# Patient Record
Sex: Female | Born: 1980
Health system: Southern US, Community
[De-identification: ages and names within clinical notes are randomized; demographics above are authoritative.]

## PROBLEM LIST (undated history)

## (undated) DIAGNOSIS — K219 Gastro-esophageal reflux disease without esophagitis: Secondary | ICD-10-CM

## (undated) DIAGNOSIS — F3112 Bipolar disorder, current episode manic without psychotic features, moderate: Secondary | ICD-10-CM

## (undated) DIAGNOSIS — F988 Other specified behavioral and emotional disorders with onset usually occurring in childhood and adolescence: Secondary | ICD-10-CM

## (undated) HISTORY — PX: CHOLECYSTECTOMY: SHX55

## (undated) HISTORY — PX: APPENDECTOMY: SHX54

## (undated) HISTORY — PX: TUMOR REMOVAL: SHX12

---

## 2013-03-10 DIAGNOSIS — J309 Allergic rhinitis, unspecified: Secondary | ICD-10-CM | POA: Insufficient documentation

## 2013-03-10 DIAGNOSIS — Z720 Tobacco use: Secondary | ICD-10-CM | POA: Insufficient documentation

## 2013-03-10 DIAGNOSIS — F319 Bipolar disorder, unspecified: Secondary | ICD-10-CM | POA: Insufficient documentation

## 2013-03-10 DIAGNOSIS — G43909 Migraine, unspecified, not intractable, without status migrainosus: Secondary | ICD-10-CM | POA: Insufficient documentation

## 2013-03-10 DIAGNOSIS — E669 Obesity, unspecified: Secondary | ICD-10-CM | POA: Insufficient documentation

## 2013-07-21 DIAGNOSIS — G5601 Carpal tunnel syndrome, right upper limb: Secondary | ICD-10-CM | POA: Insufficient documentation

## 2013-09-09 DIAGNOSIS — M940 Chondrocostal junction syndrome [Tietze]: Secondary | ICD-10-CM | POA: Insufficient documentation

## 2013-09-11 DIAGNOSIS — R0789 Other chest pain: Secondary | ICD-10-CM | POA: Insufficient documentation

## 2013-09-20 DIAGNOSIS — M9908 Segmental and somatic dysfunction of rib cage: Secondary | ICD-10-CM | POA: Insufficient documentation

## 2014-03-10 DIAGNOSIS — R5383 Other fatigue: Secondary | ICD-10-CM | POA: Insufficient documentation

## 2014-12-28 DIAGNOSIS — K529 Noninfective gastroenteritis and colitis, unspecified: Secondary | ICD-10-CM | POA: Insufficient documentation

## 2014-12-28 DIAGNOSIS — R109 Unspecified abdominal pain: Secondary | ICD-10-CM | POA: Insufficient documentation

## 2015-03-19 ENCOUNTER — Other Ambulatory Visit: Payer: Self-pay

## 2015-03-19 ENCOUNTER — Ambulatory Visit: Payer: BLUE CROSS/BLUE SHIELD

## 2015-03-19 ENCOUNTER — Encounter: Payer: Self-pay | Admitting: Emergency Medicine

## 2015-03-19 ENCOUNTER — Ambulatory Visit
Admission: EM | Admit: 2015-03-19 | Discharge: 2015-03-19 | Disposition: A | Discharge status: Home / Self Care | Payer: BLUE CROSS/BLUE SHIELD | Attending: Family Medicine | Admitting: Family Medicine

## 2015-03-19 DIAGNOSIS — R091 Pleurisy: Secondary | ICD-10-CM | POA: Diagnosis present

## 2015-03-19 DIAGNOSIS — R1032 Left lower quadrant pain: Secondary | ICD-10-CM

## 2015-03-19 DIAGNOSIS — M940 Chondrocostal junction syndrome [Tietze]: Secondary | ICD-10-CM | POA: Diagnosis not present

## 2015-03-19 DIAGNOSIS — R0789 Other chest pain: Secondary | ICD-10-CM | POA: Diagnosis not present

## 2015-03-19 DIAGNOSIS — R319 Hematuria, unspecified: Secondary | ICD-10-CM | POA: Diagnosis not present

## 2015-03-19 DIAGNOSIS — F319 Bipolar disorder, unspecified: Secondary | ICD-10-CM | POA: Diagnosis not present

## 2015-03-19 DIAGNOSIS — F17200 Nicotine dependence, unspecified, uncomplicated: Secondary | ICD-10-CM | POA: Diagnosis not present

## 2015-03-19 DIAGNOSIS — R079 Chest pain, unspecified: Secondary | ICD-10-CM | POA: Diagnosis not present

## 2015-03-19 HISTORY — DX: Other specified behavioral and emotional disorders with onset usually occurring in childhood and adolescence: F98.8

## 2015-03-19 HISTORY — DX: Bipolar disorder, current episode manic without psychotic features, moderate: F31.12

## 2015-03-19 LAB — LIPASE, BLOOD: Lipase: 25 U/L (ref 11–51)

## 2015-03-19 LAB — CBC WITH DIFFERENTIAL/PLATELET
BASOS ABS: 0.1 10*3/uL (ref 0–0.1)
Basophils Relative: 1 %
Eosinophils Absolute: 0.1 10*3/uL (ref 0–0.7)
Eosinophils Relative: 2 %
HEMATOCRIT: 42.5 % (ref 35.0–47.0)
Hemoglobin: 14.4 g/dL (ref 12.0–16.0)
LYMPHS PCT: 43 %
Lymphs Abs: 3.8 10*3/uL — ABNORMAL HIGH (ref 1.0–3.6)
MCH: 29.4 pg (ref 26.0–34.0)
MCHC: 33.8 g/dL (ref 32.0–36.0)
MCV: 86.9 fL (ref 80.0–100.0)
Monocytes Absolute: 0.5 10*3/uL (ref 0.2–0.9)
Monocytes Relative: 6 %
NEUTROS ABS: 4.4 10*3/uL (ref 1.4–6.5)
NEUTROS PCT: 50 %
Platelets: 293 10*3/uL (ref 150–440)
RBC: 4.89 MIL/uL (ref 3.80–5.20)
RDW: 13.4 % (ref 11.5–14.5)
WBC: 8.9 10*3/uL (ref 3.6–11.0)

## 2015-03-19 LAB — COMPREHENSIVE METABOLIC PANEL
ALT: 36 U/L (ref 14–54)
ANION GAP: 11 (ref 5–15)
AST: 30 U/L (ref 15–41)
Albumin: 4.6 g/dL (ref 3.5–5.0)
Alkaline Phosphatase: 184 U/L — ABNORMAL HIGH (ref 38–126)
BILIRUBIN TOTAL: 0.6 mg/dL (ref 0.3–1.2)
BUN: 11 mg/dL (ref 6–20)
CHLORIDE: 98 mmol/L — AB (ref 101–111)
CO2: 26 mmol/L (ref 22–32)
Calcium: 9.7 mg/dL (ref 8.9–10.3)
Creatinine, Ser: 0.79 mg/dL (ref 0.44–1.00)
GFR calc Af Amer: >60 mL/min (ref >60)
GLUCOSE: 91 mg/dL (ref 65–99)
POTASSIUM: 3.8 mmol/L (ref 3.5–5.1)
Sodium: 135 mmol/L (ref 135–145)
TOTAL PROTEIN: 8.2 g/dL — AB (ref 6.5–8.1)

## 2015-03-19 LAB — PREGNANCY, URINE: Preg Test, Ur: NEGATIVE

## 2015-03-19 LAB — URINALYSIS COMPLETE WITH MICROSCOPIC (ARMC ONLY)
BILIRUBIN URINE: NEGATIVE
GLUCOSE, UA: NEGATIVE mg/dL
KETONES UR: NEGATIVE mg/dL
LEUKOCYTES UA: NEGATIVE
Nitrite: NEGATIVE
Protein, ur: NEGATIVE mg/dL
Specific Gravity, Urine: 1.015 (ref 1.005–1.030)
WBC, UA: NONE SEEN WBC/hpf (ref <3)
pH: 5.5 (ref 5.0–8.0)

## 2015-03-19 LAB — TROPONIN I

## 2015-03-19 LAB — CK: CK TOTAL: 121 U/L (ref 38–234)

## 2015-03-19 LAB — AMYLASE: AMYLASE: 57 U/L (ref 28–100)

## 2015-03-19 LAB — FIBRIN DERIVATIVES D-DIMER (ARMC ONLY): FIBRIN DERIVATIVES D-DIMER (ARMC): 460.28 (ref 0–499)

## 2015-03-19 LAB — CKMB (ARMC ONLY): CK, MB: 1.5 ng/mL (ref 0.5–5.0)

## 2015-03-19 MED ORDER — KETOROLAC TROMETHAMINE 60 MG/2ML IM SOLN: 60.0000 mg | Freq: Once | INTRAMUSCULAR | Status: DC

## 2015-03-19 MED ORDER — MELOXICAM 15 MG PO TABS: 15.0000 mg | ORAL_TABLET | Freq: Every day | ORAL | Status: DC

## 2015-03-19 NOTE — Discharge Instructions (Signed)
Abdominal Pain, Adult Many things can cause belly (abdominal) pain. Most times, the belly pain is not dangerous. Many cases of belly pain can be watched and treated at home. HOME CARE   Do not take medicines that help you go poop (laxatives) unless told to by your doctor.  Only take medicine as told by your doctor.  Eat or drink as told by your doctor. Your doctor will tell you if you should be on a special diet. GET HELP IF:  You do not know what is causing your belly pain.  You have belly pain while you are sick to your stomach (nauseous) or have runny poop (diarrhea).  You have pain while you pee or poop.  Your belly pain wakes you up at night.  You have belly pain that gets worse or better when you eat.  You have belly pain that gets worse when you eat fatty foods.  You have a fever. GET HELP RIGHT AWAY IF:   The pain does not go away within 2 hours.  You keep throwing up (vomiting).  The pain changes and is only in the right or left part of the belly.  You have bloody or tarry looking poop. MAKE SURE YOU:   Understand these instructions.  Will watch your condition.  Will get help right away if you are not doing well or get worse.   This information is not intended to replace advice given to you by your health care provider. Make sure you discuss any questions you have with your health care provider.   Document Released: 10/22/2007 Document Revised: 05/26/2014 Document Reviewed: 01/12/2013 Elsevier Interactive Patient Education 2016 Else[employer]  Chest Wall Pain Chest wall pain is pain in or around the bones and muscles of your chest. Sometimes, an injury causes this pain. Sometimes, the cause may not be known. This pain may take several weeks or longer to get better. HOME CARE Pay attention to any changes in your symptoms. Take these actions to help with your pain:  Rest as told by your doctor.  Avoid activities that cause pain. Try not to use your chest,  belly (abdominal), or side muscles to lift heavy things.  If directed, apply ice to the painful area:  Put ice in a plastic bag.  Place a towel between your skin and the bag.  Leave the ice on for 20 minutes, 2-3 times per day.  Take over-the-counter and prescription medicines only as told by your doctor.  Do not use tobacco products, including cigarettes, chewing tobacco, and e-cigarettes. If you need help quitting, ask your doctor.  Keep all follow-up visits as told by your doctor. This is important. GET HELP IF:  You have a fever.  Your chest pain gets worse.  You have new symptoms. GET HELP RIGHT AWAY IF:  You feel sick to your stomach (nauseous) or you throw up (vomit).  You feel sweaty or light-headed.  You have a cough with phlegm (sputum) or you cough up blood.  You are short of breath.   This information is not intended to replace advice given to you by your health care provider. Make sure you discuss any questions you have with your health care provider.   Document Released: 10/22/2007 Document Revised: 01/24/2015 Document Reviewed: 07/31/2014 Elsevier Interactive Patient Education 2016 Else[employer] Inc[employer]  Costochondritis Costochondritis is a condition in which the tissue (cartilage) that connects your ribs with your breastbone (sternum) becomes irritated. It causes pain in the chest and rib area. It  usually goes away on its own over time. HOME CARE  Avoid activities that wear you out.  Do not strain your ribs. Avoid activities that use your:  Chest.  Belly.  Side muscles.  Put ice on the area for the first 2 days after the pain starts.  Put ice in a plastic bag.  Place a towel between your skin and the bag.  Leave the ice on for 20 minutes, 2-3 times a day.  Only take medicine as told by your doctor. GET HELP IF:  You have redness or puffiness (swelling) in the rib area.  Your pain does not go away with rest or medicine. GET HELP RIGHT AWAY IF:     Your pain gets worse.  You are very uncomfortable.  You have trouble breathing.  You cough up blood.  You start sweating or throwing up (vomiting).  You have a fever or lasting symptoms for more than 2-3 days.  You have a fever and your symptoms suddenly get worse. MAKE SURE YOU:   Understand these instructions.  Will watch your condition.  Will get help right away if you are not doing well or get worse.   This information is not intended to replace advice given to you by your health care provider. Make sure you discuss any questions you have with your health care provider.   Document Released: 10/22/2007 Document Revised: 01/05/2013 Document Reviewed: 12/07/2012 Elsevier Interactive Patient Education 2016 Else[employer] Inc.  Nonspecific Chest Pain It is often hard to find the cause of chest pain. There is always a chance that your pain could be related to something serious, such as a heart attack or a blood clot in your lungs. Chest pain can also be caused by conditions that are not life-threatening. If you have chest pain, it is very important to follow up with your doctor.  HOME CARE  If you were prescribed an antibiotic medicine, finish it all even if you start to feel better.  Avoid any activities that cause chest pain.  Do not use any tobacco products, including cigarettes, chewing tobacco, or electronic cigarettes. If you need help quitting, ask your doctor.  Do not drink alcohol.  Take medicines only as told by your doctor.  Keep all follow-up visits as told by your doctor. This is important. This includes any further testing if your chest pain does not go away.  Your doctor may tell you to keep your head raised (elevated) while you sleep.  Make lifestyle changes as told by your doctor. These may include:  Getting regular exercise. Ask your doctor to suggest some activities that are safe for you.  Eating a heart-healthy diet. Your doctor or a diet specialist  (dietitian) can help you to learn healthy eating options.  Maintaining a healthy weight.  Managing diabetes, if necessary.  Reducing stress. GET HELP IF:  Your chest pain does not go away, even after treatment.  You have a rash with blisters on your chest.  You have a fever. GET HELP RIGHT AWAY IF:  Your chest pain is worse.  You have an increasing cough, or you cough up blood.  You have severe belly (abdominal) pain.  You feel extremely weak.  You pass out (faint).  You have chills.  You have sudden, unexplained chest discomfort.  You have sudden, unexplained discomfort in your arms, back, neck, or jaw.  You have shortness of breath at any time.  You suddenly start to sweat, or your skin gets clammy.  You  feel nauseous.  You vomit.  You suddenly feel light-headed or dizzy.  Your heart begins to beat quickly, or it feels like it is skipping beats. These symptoms may be an emergency. Do not wait to see if the symptoms will go away. Get medical help right away. Call your local emergency services (911 in the [place] Do not drive yourself to the hospital.   This information is not intended to replace advice given to you by your health care provider. Make sure you discuss any questions you have with your health care provider.   Document Released: 10/22/2007 Document Revised: 05/26/2014 Document Reviewed: 12/09/2013 Elsevier Interactive Patient Education 2016 Elsevier Inc.  Flank Pain Flank pain is pain in your side. The flank is the area of your side between your upper belly (abdomen) and your back. Pain in this area can be caused by many different things. [place] care and treatment will depend on the cause of your pain.  Rest as told by your doctor.  Drink enough fluids to keep your pee (urine) clear or pale yellow.  Only take medicine as told by your doctor.  Tell your doctor about any changes in your pain.  Follow up with your doctor. GET HELP  RIGHT AWAY IF:   Your pain does not get better with medicine.   You have new symptoms or your symptoms get worse.  Your pain gets worse.   You have belly (abdominal) pain.   You are short of breath.   You always feel sick to your stomach (nauseous).   You keep throwing up (vomiting).   You have puffiness (swelling) in your belly.   You feel light-headed or you pass out (faint).   You have blood in your pee.  You have a fever or lasting symptoms for more than 2-3 days.  You have a fever and your symptoms suddenly get worse. MAKE SURE YOU:   Understand these instructions.  Will watch your condition.  Will get help right away if you are not doing well or get worse.   This information is not intended to replace advice given to you by your health care provider. Make sure you discuss any questions you have with your health care provider.   Document Released: 02/12/2008 Document Revised: 05/26/2014 Document Reviewed: 12/18/2011 Elsevier Interactive Patient Education [employer].

## 2015-03-19 NOTE — ED Provider Notes (Signed)
CSN: [id number]     Arrival date & time 03/19/15  1720 History   First MD Initiated Contact with Patient 03/19/15 1741     Nurses notes were reviewed. Chief Complaint  Patient presents with  . Pleurisy   patient reports having chest pain started yesterday after playing softball. She reports pain is in the left side of her chest. The some radiation to her neck and down her left shoulder according to her. She also reports having some abdominal pain has been going off and on for about 2 weeks. She reports having costochondritis few years ago and was seen in emergency room and given that diagnosis. However this pain somewhat different she does feel when she takes deep breath and she exhibits itself but it did not have any radiation like it did this time up her neck down the left shoulder and a little radiation towards the back as well. She states it feels more than the left breast in the left sternum. Risk factors she does smoke and visits from family history of heart disease with the mother in her 79s having stents and her father dying of coronary artery disease in his 64s she's not aware of any elevation of her cholesterol and denies having history hypertension. (Consider location/radiation/quality/duration/timing/severity/associated sxs/prior Treatment) Patient is a 34 y.o. female presenting with chest pain. The history is provided by the patient. No language interpreter was used.  Chest Pain Pain location:  Substernal area and L chest Pain quality: radiating, sharp, shooting and stabbing   Pain radiates to:  L jaw and L shoulder Pain radiates to the back: yes   Pain severity:  Moderate Onset quality:  Gradual Duration:  1 day Timing:  Intermittent Progression:  Waxing and waning Chronicity:  New Context: breathing and movement   Context: no drug use, not eating, not lifting, not raising an arm, not at rest and no trauma   Relieved by:  Rest Worsened by:  Deep breathing and  movement Ineffective treatments:  None tried Associated symptoms: abdominal pain   Associated symptoms: no altered mental status, no anorexia, no anxiety, no cough, no dysphagia, no fatigue, no fever, no headache, no heartburn, no nausea, no palpitations, no shortness of breath, not vomiting and no weakness   Risk factors: birth control, obesity, smoking and surgery   Risk factors: no aortic disease, no coronary artery disease, no high cholesterol, no hypertension, not female, no Marfan's syndrome, not pregnant and no prior DVT/PE     Past Medical History  Diagnosis Date  . Bipolar 1 disorder with moderate mania ([place])   . ADD (attention deficit disorder)    Past Surgical History  Procedure Laterality Date  . Appendectomy    . Cholecystectomy     History reviewed. No pertinent family history. Social History  Substance Use Topics  . Smoking status: Current Some Day Smoker  . Smokeless tobacco: None  . Alcohol Use: No   OB History    No data available     Review of Systems  Constitutional: Negative for fever and fatigue.  HENT: Negative for trouble swallowing.   Respiratory: Negative for cough and shortness of breath.   Cardiovascular: Positive for chest pain. Negative for palpitations.  Gastrointestinal: Positive for abdominal pain. Negative for heartburn, nausea, vomiting and anorexia.  Neurological: Negative for weakness and headaches.    Allergies  Morphine and related and Phenergan  Home Medications   Prior to Admission medications   Medication Sig Start Date End Date Taking? Authorizing  Provider  lamoTRIgine (LAMICTAL) 200 MG tablet Take 200 mg by mouth daily.   Yes Historical Provider, MD  lisdexamfetamine (VYVANSE) 70 MG capsule Take 70 mg by mouth daily.   Yes Historical Provider, MD  oxcarbazepine (TRILEPTAL) 600 MG tablet Take 600 mg by mouth 2 (two) times daily.   Yes Historical Provider, MD  meloxicam (MOBIC) 15 MG tablet Take 1 tablet (15 mg total) by mouth  daily. 03/19/15   Frederich Cha, MD   Meds Ordered and Administered this Visit   Medications  ketorolac (TORADOL) injection 60 mg (60 mg Intramuscular Not Given 03/19/15 1830)    BP 123/74 mmHg  Pulse 89  Temp(Src) 98 F (36.7 C) (Tympanic)  Resp 20  Ht 5\' 3"  (1.6 m)  Wt 202 lb (91.627 kg)  BMI 35.79 kg/m2  SpO2 100%  LMP 02/14/2015 No data found.   Physical Exam  Constitutional: She is oriented to person, place, and time. She appears well-developed and well-nourished.  HENT:  Head: Normocephalic and atraumatic.  Right Ear: External ear normal.  Left Ear: External ear normal.  Mouth/Throat: Oropharynx is clear and moist.  Eyes: Pupils are equal, round, and reactive to light.  Neck: Normal range of motion. Neck supple. No tracheal deviation present.  Cardiovascular: Normal rate, regular rhythm, normal heart sounds and intact distal pulses.   Pulmonary/Chest: Effort normal and breath sounds normal. No respiratory distress.  Abdominal: Soft. There is tenderness.  Musculoskeletal: Normal range of motion.  Lymphadenopathy:    She has no cervical adenopathy.  Neurological: She is alert and oriented to person, place, and time.  Skin: Skin is warm and dry.  Psychiatric: She has a normal mood and affect. Her behavior is normal.  Vitals reviewed.   ED Course  Procedures (including critical care time)  Labs Review Labs Reviewed  CBC WITH DIFFERENTIAL/PLATELET - Abnormal; Notable for the following:    Lymphs Abs 3.8 (*)    All other components within normal limits  COMPREHENSIVE METABOLIC PANEL - Abnormal; Notable for the following:    Chloride 98 (*)    Total Protein 8.2 (*)    Alkaline Phosphatase 184 (*)    All other components within normal limits  URINALYSIS COMPLETEWITH MICROSCOPIC ([facility] ONLY) - Abnormal; Notable for the following:    Hgb urine dipstick 2+ (*)    Bacteria, UA FEW (*)    Squamous Epithelial / LPF 0-5 (*)    All other components within normal limits   URINE CULTURE  AMYLASE  LIPASE, BLOOD  TROPONIN I  CK  CKMB([facility] ONLY)  PREGNANCY, URINE  FIBRIN DERIVATIVES D-DIMER ([facility] ONLY)    Imaging Review Dg Abd Acute W/chest  03/19/2015  CLINICAL DATA:  Right lower quadrant abdominal pain and left-sided chest pain. EXAM: DG ABDOMEN ACUTE W/ 1V CHEST COMPARISON:  None. FINDINGS: There is no evidence of dilated bowel loops or free intraperitoneal air. Clips are seen related to prior cholecystectomy. No radiopaque calculi or abnormal calcifications. No soft tissue abnormalities identified. Heart size and mediastinal contours are within normal limits. There is no evidence of pulmonary edema, consolidation, pneumothorax, nodule or pleural fluid. IMPRESSION: Negative abdominal radiographs.  No acute cardiopulmonary disease. Electronically Signed   By: Aletta Edouard M.D.   On: 03/19/2015 18:53     Visual Acuity Review  Right Eye Distance:   Left Eye Distance:   Bilateral Distance:    Right Eye Near:   Left Eye Near:    Bilateral Near:      ED  ECG REPORT   Date: 03/19/2015  EKG Time: 8:10 PM  Rate: 72   Rhythm: normal sinus rhythm,  normal EKG, normal sinus rhythm, there are no previous tracings available for comparison  Axis: 42  Intervals:none  ST&T Change: none  Narrative Interpretation: sinus rhythm      Results for orders placed or performed during the hospital encounter of 03/19/15  CBC with Differential  Result Value Ref Range   WBC 8.9 3.6 - 11.0 K/uL   RBC 4.89 3.80 - 5.20 MIL/uL   Hemoglobin 14.4 12.0 - 16.0 g/dL   HCT 42.5 35.0 - 47.0 %   MCV 86.9 80.0 - 100.0 fL   MCH 29.4 26.0 - 34.0 pg   MCHC 33.8 32.0 - 36.0 g/dL   RDW 13.4 11.5 - 14.5 %   Platelets 293 150 - 440 K/uL   Neutrophils Relative % 50 %   Neutro Abs 4.4 1.4 - 6.5 K/uL   Lymphocytes Relative 43 %   Lymphs Abs 3.8 (H) 1.0 - 3.6 K/uL   Monocytes Relative 6 %   Monocytes Absolute 0.5 0.2 - 0.9 K/uL   Eosinophils Relative 2 %   Eosinophils  Absolute 0.1 0 - 0.7 K/uL   Basophils Relative 1 %   Basophils Absolute 0.1 0 - 0.1 K/uL  Comprehensive metabolic panel  Result Value Ref Range   Sodium 135 135 - 145 mmol/L   Potassium 3.8 3.5 - 5.1 mmol/L   Chloride 98 (L) 101 - 111 mmol/L   CO2 26 22 - 32 mmol/L   Glucose, Bld 91 65 - 99 mg/dL   BUN 11 6 - 20 mg/dL   Creatinine, Ser 0.79 0.44 - 1.00 mg/dL   Calcium 9.7 8.9 - 10.3 mg/dL   Total Protein 8.2 (H) 6.5 - 8.1 g/dL   Albumin 4.6 3.5 - 5.0 g/dL   AST 30 15 - 41 U/L   ALT 36 14 - 54 U/L   Alkaline Phosphatase 184 (H) 38 - 126 U/L   Total Bilirubin 0.6 0.3 - 1.2 mg/dL   GFR calc non Af Amer >60 >60 mL/min   GFR calc Af Amer >60 >60 mL/min   Anion gap 11 5 - 15  Amylase  Result Value Ref Range   Amylase 57 28 - 100 U/L  Lipase, blood  Result Value Ref Range   Lipase 25 11 - 51 U/L  Troponin I  Result Value Ref Range   Troponin I <0.03 <0.031 ng/mL  CK  Result Value Ref Range   Total CK 121 38 - 234 U/L  CKMB([facility] only)  Result Value Ref Range   CK, MB 1.5 0.5 - 5.0 ng/mL  Urinalysis complete, with microscopic  Result Value Ref Range   Color, Urine YELLOW YELLOW   APPearance CLEAR CLEAR   Glucose, UA NEGATIVE NEGATIVE mg/dL   Bilirubin Urine NEGATIVE NEGATIVE   Ketones, ur NEGATIVE NEGATIVE mg/dL   Specific Gravity, Urine 1.015 1.005 - 1.030   Hgb urine dipstick 2+ (A) NEGATIVE   pH 5.5 5.0 - 8.0   Protein, ur NEGATIVE NEGATIVE mg/dL   Nitrite NEGATIVE NEGATIVE   Leukocytes, UA NEGATIVE NEGATIVE   RBC / HPF 6-30 <3 RBC/hpf   WBC, UA NONE SEEN <3 WBC/hpf   Bacteria, UA FEW (A) RARE   Squamous Epithelial / LPF 0-5 (A) RARE  Pregnancy, urine  Result Value Ref Range   Preg Test, Ur NEGATIVE NEGATIVE  Fibrin derivatives D-Dimer ([facility] only)  Result Value Ref Range  Fibrin derivatives D-dimer ([facility]) 460.28 0 - 499         MDM   1. Costochondritis, acute   2. Chest wall pain   3. Abdominal pain, LLQ   4. Hematuria       I have explained to  patient at this time no life threatening illness can be found. Concern over the hematuria and l have recommended she strain her urine for a small stone. If a small stone was not recovered in 2-3 days she still has left flank pain she needs to see if we have CT scan here or go to the emergency room for abdominal CT scan. She was somewhat disappointed that ultrasound would not pick up a kidney stone explained to her that the flat plate is not going to get up and we had no signs of hydronephrosis ultrasound is not going to be helpful when a flatplate does not pick up a small stone. Unlike appendicitis ultrasound is not going to help. She is also concerned about having recurrent costochondritis. Explained to her that she was also playing baseball when she had been playing for a while she may just pulled a muscle or injured her chest wall. If she is really concerned about heart disease she continues to have chest pain she should go to the emergency room to be reevaluated and another option if the chest pain improves and she just wants to be sure everything is okay is try to get her PCP to order a stress test. Strongly suggest she stop smoking. At this time no life-threatening illness appears to be present. Work note given for today and tomorrow. Toradol 60 mg have been ordered but she declined that she wanted to make sure she had explained to her the Toradol did been helpful in confirming the diagnosis but will go ahead and place on Mobic 15 mg daily.  Frederich Cha, MD 03/19/15 2022

## 2015-03-19 NOTE — ED Notes (Signed)
Pt with left side chest pain x 2 days

## 2015-03-30 ENCOUNTER — Ambulatory Visit (INDEPENDENT_AMBULATORY_CARE_PROVIDER_SITE_OTHER): Payer: BLUE CROSS/BLUE SHIELD | Admitting: Family Medicine

## 2015-03-30 ENCOUNTER — Encounter: Payer: Self-pay | Admitting: Family Medicine

## 2015-03-30 VITALS — BP 116/85 | HR 95 | Temp 97.7°F | Ht 62.5 in | Wt 202.0 lb

## 2015-03-30 DIAGNOSIS — F909 Attention-deficit hyperactivity disorder, unspecified type: Secondary | ICD-10-CM

## 2015-03-30 DIAGNOSIS — R0789 Other chest pain: Secondary | ICD-10-CM

## 2015-03-30 DIAGNOSIS — R319 Hematuria, unspecified: Secondary | ICD-10-CM

## 2015-03-30 DIAGNOSIS — F3112 Bipolar disorder, current episode manic without psychotic features, moderate: Secondary | ICD-10-CM

## 2015-03-30 DIAGNOSIS — F988 Other specified behavioral and emotional disorders with onset usually occurring in childhood and adolescence: Secondary | ICD-10-CM | POA: Insufficient documentation

## 2015-03-30 LAB — UA/M W/RFLX CULTURE, ROUTINE
Bilirubin, UA: NEGATIVE
GLUCOSE, UA: NEGATIVE
KETONES UA: NEGATIVE
LEUKOCYTES UA: NEGATIVE
NITRITE UA: NEGATIVE
PROTEIN UA: NEGATIVE
SPEC GRAV UA: 1.02 (ref 1.005–1.030)
Urobilinogen, Ur: 0.2 mg/dL (ref 0.2–1.0)
pH, UA: 6 (ref 5.0–7.5)

## 2015-03-30 LAB — MICROSCOPIC EXAMINATION

## 2015-03-30 NOTE — Progress Notes (Signed)
BP 116/85 mmHg  Pulse 95  Temp(Src) 97.7 F (36.5 C)  Ht 5' 2.5" (1.588 m)  Wt 202 lb (91.627 kg)  BMI 36.33 kg/m2  SpO2 99%  LMP 03/08/2015 (Approximate)   Subjective:    Patient ID: Jasmine Tran, female    DOB: 24-Apr-1981, 34 y.o.   MRN: [id number]  HPI: Jasmine Tran is a 34 y.o. female who presents today to establish care  Chief Complaint  Patient presents with  . Establish Care   Bipolar has been stable, never hospitalized seeing psychiatry, but wants to see if we can take care of it.  ADD has also been stable per patient.   CHEST PAIN Time since onset: couple of months, has had EKG x3 which were normal, lasts about 30 minutes, comes on at random times Duration: about 30 min Onset: sudden Quality: sharp and dull Severity: 5/10 Location: upper left Radiation: neck Episode duration: 30 minutes Frequency: 4x a day Related to exertion: no Activity when pain started: anything Trauma: no Anxiety/recent stressors: no Aggravating factors: nothing Alleviating factors: time Status: worse and fluctuating Treatments attempted: nothing  Current pain status: in pain Shortness of breath: no Cough: no Nausea: no Diaphoresis: no Heartburn: yes Palpitations: no  HEMATURIA- last 2x she had a UA Duration: about 2 months Gross hematuria: yes Microscopic hematuria: yes Dipstick hematuria: unknown Duration: unknown Circumstances of discovering hematuria:  Proteinuria: none Red cell casts: unknown Dark brown/cola colored urine: no Clots in urine: no Recent urine culture: yes- yeast Dysuria/urinary frequency: no Urinary incontinence: no Urinary hesistancy/dribbling: no Back pain: yes Suprapubic pain/pressure: no Flank pain: yes Recent URI: yes Recent vigorous exercise/trauma: no Status: unknown  Relevant past medical, surgical, family and social history reviewed and updated as indicated. Interim medical history since our last visit reviewed. Allergies and  medications reviewed and updated.  Review of Systems  Constitutional: Negative.   HENT: Negative.   Respiratory: Negative.   Cardiovascular: Negative.   Genitourinary: Negative.   Musculoskeletal: Negative.   Psychiatric/Behavioral: Negative.    Per HPI unless specifically indicated above    Objective:    BP 116/85 mmHg  Pulse 95  Temp(Src) 97.7 F (36.5 C)  Ht 5' 2.5" (1.588 m)  Wt 202 lb (91.627 kg)  BMI 36.33 kg/m2  SpO2 99%  LMP 03/08/2015 (Approximate)  Wt Readings from Last 3 Encounters:  03/30/15 202 lb (91.627 kg)  03/19/15 202 lb (91.627 kg)    Physical Exam  Constitutional: She is oriented to person, place, and time. She appears well-developed and well-nourished. No distress.  HENT:  Head: Normocephalic and atraumatic.  Right Ear: Hearing normal.  Left Ear: Hearing normal.  Nose: Nose normal.  Eyes: Conjunctivae and lids are normal. Right eye exhibits no discharge. Left eye exhibits no discharge. No scleral icterus.  Neck: Normal range of motion. Neck supple. No JVD present. No tracheal deviation present. No thyromegaly present.  Cardiovascular: Normal rate, regular rhythm, normal heart sounds and intact distal pulses.  Exam reveals no gallop and no friction rub.   No murmur heard. Pulmonary/Chest: Effort normal and breath sounds normal. No stridor. No respiratory distress. She has no wheezes. She has no rales. She exhibits tenderness.  Musculoskeletal: Normal range of motion. She exhibits tenderness. She exhibits no edema.  Tender to palpation of her chest reproducing her symptoms. L pec tenderness, normal ROM  Lymphadenopathy:    She has no cervical adenopathy.  Neurological: She is alert and oriented to person, place, and time.  Skin: Skin is  warm, dry and intact. No rash noted. No erythema. No pallor.  Psychiatric: She has a normal mood and affect. Her speech is normal and behavior is normal. Judgment and thought content normal. Cognition and memory are  normal.  Nursing note and vitals reviewed.   Results for orders placed or performed during the hospital encounter of 03/19/15  CBC with Differential  Result Value Ref Range   WBC 8.9 3.6 - 11.0 K/uL   RBC 4.89 3.80 - 5.20 MIL/uL   Hemoglobin 14.4 12.0 - 16.0 g/dL   HCT 42.5 35.0 - 47.0 %   MCV 86.9 80.0 - 100.0 fL   MCH 29.4 26.0 - 34.0 pg   MCHC 33.8 32.0 - 36.0 g/dL   RDW 13.4 11.5 - 14.5 %   Platelets 293 150 - 440 K/uL   Neutrophils Relative % 50 %   Neutro Abs 4.4 1.4 - 6.5 K/uL   Lymphocytes Relative 43 %   Lymphs Abs 3.8 (H) 1.0 - 3.6 K/uL   Monocytes Relative 6 %   Monocytes Absolute 0.5 0.2 - 0.9 K/uL   Eosinophils Relative 2 %   Eosinophils Absolute 0.1 0 - 0.7 K/uL   Basophils Relative 1 %   Basophils Absolute 0.1 0 - 0.1 K/uL  Comprehensive metabolic panel  Result Value Ref Range   Sodium 135 135 - 145 mmol/L   Potassium 3.8 3.5 - 5.1 mmol/L   Chloride 98 (L) 101 - 111 mmol/L   CO2 26 22 - 32 mmol/L   Glucose, Bld 91 65 - 99 mg/dL   BUN 11 6 - 20 mg/dL   Creatinine, Ser 0.79 0.44 - 1.00 mg/dL   Calcium 9.7 8.9 - 10.3 mg/dL   Total Protein 8.2 (H) 6.5 - 8.1 g/dL   Albumin 4.6 3.5 - 5.0 g/dL   AST 30 15 - 41 U/L   ALT 36 14 - 54 U/L   Alkaline Phosphatase 184 (H) 38 - 126 U/L   Total Bilirubin 0.6 0.3 - 1.2 mg/dL   GFR calc non Af Amer >60 >60 mL/min   GFR calc Af Amer >60 >60 mL/min   Anion gap 11 5 - 15  Amylase  Result Value Ref Range   Amylase 57 28 - 100 U/L  Lipase, blood  Result Value Ref Range   Lipase 25 11 - 51 U/L  Troponin I  Result Value Ref Range   Troponin I <0.03 <0.031 ng/mL  CK  Result Value Ref Range   Total CK 121 38 - 234 U/L  CKMB([facility] only)  Result Value Ref Range   CK, MB 1.5 0.5 - 5.0 ng/mL  Urinalysis complete, with microscopic  Result Value Ref Range   Color, Urine YELLOW YELLOW   APPearance CLEAR CLEAR   Glucose, UA NEGATIVE NEGATIVE mg/dL   Bilirubin Urine NEGATIVE NEGATIVE   Ketones, ur NEGATIVE NEGATIVE mg/dL    Specific Gravity, Urine 1.015 1.005 - 1.030   Hgb urine dipstick 2+ (A) NEGATIVE   pH 5.5 5.0 - 8.0   Protein, ur NEGATIVE NEGATIVE mg/dL   Nitrite NEGATIVE NEGATIVE   Leukocytes, UA NEGATIVE NEGATIVE   RBC / HPF 6-30 <3 RBC/hpf   WBC, UA NONE SEEN <3 WBC/hpf   Bacteria, UA FEW (A) RARE   Squamous Epithelial / LPF 0-5 (A) RARE  Pregnancy, urine  Result Value Ref Range   Preg Test, Ur NEGATIVE NEGATIVE  Fibrin derivatives D-Dimer ([facility] only)  Result Value Ref Range   Fibrin derivatives D-dimer [facility])  460.28 0 - 499      Records from former PCP and ER reviewed today.  Assessment & Plan:   Problem List Items Addressed This Visit      Other   Bipolar 1 disorder with moderate mania ([place])    States that she has been stable. Would like [place] to take over medication. Discussed that I would need to see the records from her psychiatrist, that I do not change medicines for bipolar, but that I might be able to continue her medications. Will await records.       ADD (attention deficit disorder)    States that she has been stable. Would like [place] to take over medication. Discussed that I would need to see the records from her psychiatrist, that I do not change medicines for bipolar, but that I might be able to continue her medications. Will await records.       Hematuria - Primary    0-2 on HPF today. Will recheck at her physical. Continue to monitor, recheck next visit. Asymptomatic.       Relevant Orders   UA/M w/rflx Culture, Routine    Other Visit Diagnoses    Other chest pain        Appears to be musculoskeletal. Chari Manning due to a pec spasm. Work on stretches and get well fitting bra. Follow up in 3 weeks to see how she's doing.         Follow up plan: Return 3-4 weeks, for PE.

## 2015-03-30 NOTE — Assessment & Plan Note (Signed)
0-2 on HPF today. Will recheck at her physical. Continue to monitor, recheck next visit. Asymptomatic.

## 2015-03-30 NOTE — Assessment & Plan Note (Signed)
States that she has been stable. Would like [place] to take over medication. Discussed that I would need to see the records from her psychiatrist, that I do not change medicines for bipolar, but that I might be able to continue her medications. Will await records.

## 2015-04-10 ENCOUNTER — Encounter: Payer: Self-pay | Admitting: Family Medicine

## 2015-04-10 ENCOUNTER — Ambulatory Visit (INDEPENDENT_AMBULATORY_CARE_PROVIDER_SITE_OTHER): Payer: BLUE CROSS/BLUE SHIELD | Admitting: Family Medicine

## 2015-04-10 VITALS — BP 115/74 | HR 92 | Temp 97.3°F | Ht 62.3 in | Wt 202.0 lb

## 2015-04-10 DIAGNOSIS — R0789 Other chest pain: Secondary | ICD-10-CM

## 2015-04-10 DIAGNOSIS — R1012 Left upper quadrant pain: Secondary | ICD-10-CM

## 2015-04-10 DIAGNOSIS — R1032 Left lower quadrant pain: Secondary | ICD-10-CM | POA: Diagnosis not present

## 2015-04-10 MED ORDER — CYCLOBENZAPRINE HCL 10 MG PO TABS: 10.0000 mg | ORAL_TABLET | Freq: Three times a day (TID) | ORAL | Status: DC | PRN

## 2015-04-10 NOTE — Progress Notes (Signed)
BP 115/74 mmHg  Pulse 92  Temp(Src) 97.3 F (36.3 C)  Ht 5' 2.3" (1.582 m)  Wt 202 lb (91.627 kg)  BMI 36.61 kg/m2  SpO2 98%  LMP 03/08/2015 (Approximate)   Subjective:    Patient ID: Jasmine Tran, female    DOB: 1980-11-16, 34 y.o.   MRN: [id number]  HPI: Jasmine Tran is a 34 y.o. female  Chief Complaint  Patient presents with  . Chest Pain    patient states that the pain is on the left side, it goes to her lower back, it seems to be getting worse, no changes since last visit.   CHEST PAIN- hasn't been as frequent, but feels like it's now underneath her ribs and under her back, feels like it's a lot of pressure. Has been acting up about 2 days now Time since onset: couple of months, has had EKG x3 which were normal, lasts about 30 minutes, comes on at random times, has had her colon and spleen checked out with an MRI in the past. Had tumor on her liver caused by birth control and stopped the birth control Duration: most of the day Onset: sudden Quality: sharp and dull Severity: 8/10 Location: upper left, going down under her ribs Radiation: neck and into her ribs and low back Episode duration: most of the day Frequency: most of the day Related to exertion: no Activity when pain started: anything Trauma: no Anxiety/recent stressors: no Aggravating factors: nothing Alleviating factors: time Status: worse and fluctuating Treatments attempted: nothing  Current pain status: in pain Shortness of breath: no Cough: no Nausea: no Diaphoresis: no Heartburn: yes Palpitations: no  Relevant past medical, surgical, family and social history reviewed and updated as indicated. Interim medical history since our last visit reviewed. Allergies and medications reviewed and updated.  Review of Systems  Constitutional: Negative.   Respiratory: Negative.   Cardiovascular: Negative.   Gastrointestinal: Negative.   Musculoskeletal: Negative.   Psychiatric/Behavioral: Negative.     Per HPI unless specifically indicated above     Objective:    BP 115/74 mmHg  Pulse 92  Temp(Src) 97.3 F (36.3 C)  Ht 5' 2.3" (1.582 m)  Wt 202 lb (91.627 kg)  BMI 36.61 kg/m2  SpO2 98%  LMP 03/08/2015 (Approximate)  Wt Readings from Last 3 Encounters:  04/10/15 202 lb (91.627 kg)  03/30/15 202 lb (91.627 kg)  03/19/15 202 lb (91.627 kg)    Physical Exam  Constitutional: She is oriented to person, place, and time. She appears well-developed and well-nourished. No distress.  HENT:  Head: Normocephalic and atraumatic.  Right Ear: Hearing normal.  Left Ear: Hearing normal.  Nose: Nose normal.  Eyes: Conjunctivae and lids are normal. Right eye exhibits no discharge. Left eye exhibits no discharge. No scleral icterus.  Cardiovascular: Normal rate, regular rhythm, normal heart sounds and intact distal pulses.  Exam reveals no gallop and no friction rub.   No murmur heard. Pulmonary/Chest: Effort normal and breath sounds normal. No respiratory distress. She has no wheezes. She has no rales. She exhibits no tenderness.  Abdominal: Soft. Normal appearance and bowel sounds are normal. She exhibits no distension and no mass. There is hepatomegaly. There is no hepatosplenomegaly or splenomegaly. There is tenderness in the left upper quadrant and left lower quadrant. There is no rebound, no guarding and no CVA tenderness. No hernia.  Musculoskeletal: Normal range of motion.  Neurological: She is alert and oriented to person, place, and time.  Skin: Skin is warm, dry and  intact. No rash noted. No erythema. No pallor.  Psychiatric: She has a normal mood and affect. Her speech is normal and behavior is normal. Judgment and thought content normal. Cognition and memory are normal.  Nursing note and vitals reviewed.   Results for orders placed or performed in visit on 03/30/15  Microscopic Examination  Result Value Ref Range   WBC, UA 0-5 0 -  5 /hpf   RBC, UA 0-2 0 -  2 /hpf    Epithelial Cells (non renal) 0-10 0 - 10 /hpf   Bacteria, UA Few None seen/Few  UA/M w/rflx Culture, Routine  Result Value Ref Range   Specific Gravity, UA 1.020 1.005 - 1.030   pH, UA 6.0 5.0 - 7.5   Color, UA Yellow Yellow   Appearance Ur Cloudy (A) Clear   Leukocytes, UA Negative Negative   Protein, UA Negative Negative/Trace   Glucose, UA Negative Negative   Ketones, UA Negative Negative   RBC, UA 1+ (A) Negative   Bilirubin, UA Negative Negative   Urobilinogen, Ur 0.2 0.2 - 1.0 mg/dL   Nitrite, UA Negative Negative   Microscopic Examination See below:       Assessment & Plan:   Problem List Items Addressed This Visit    None    Visit Diagnoses    Other chest pain    -  Primary    Likely muscloskeletal. Will check labs. Will try flexeril. Check in next week.     Relevant Orders    Comprehensive metabolic panel    CBC with Differential/Platelet    H. pylori antibody, IgA    H. pylori antibody, IgG    Lipase    Amylase    LUQ abdominal pain        Will check labs. Will order ultrasound given LLQ pain to r/o ovarian cyst. Likely musculoskeletal pain, will try flexeril. Check in next week.     Relevant Orders    Comprehensive metabolic panel    CBC with Differential/Platelet    H. pylori antibody, IgA    H. pylori antibody, IgG    Lipase    Amylase    US Pelvis Limited    US Transvaginal Non-OB    LLQ pain        Will check labs. Will order ultrasound given LLQ pain to r/o ovarian cyst. Likely musculoskeletal pain, will try flexeril. Check in next week.         Follow up plan: Return As scheduled , for next week.

## 2015-04-11 LAB — COMPREHENSIVE METABOLIC PANEL
A/G RATIO: 1.6 (ref 1.1–2.5)
ALK PHOS: 219 IU/L — AB (ref 39–117)
ALT: 42 IU/L — ABNORMAL HIGH (ref 0–32)
AST: 27 IU/L (ref 0–40)
Albumin: 4.4 g/dL (ref 3.5–5.5)
BUN/Creatinine Ratio: 16 (ref 8–20)
BUN: 12 mg/dL (ref 6–20)
Bilirubin Total: 0.2 mg/dL (ref 0.0–1.2)
CHLORIDE: 98 mmol/L (ref 97–106)
CO2: 25 mmol/L (ref 18–29)
Calcium: 9.8 mg/dL (ref 8.7–10.2)
Creatinine, Ser: 0.76 mg/dL (ref 0.57–1.00)
GFR calc Af Amer: 120 mL/min/{1.73_m2} (ref >59)
GFR, EST NON AFRICAN AMERICAN: 104 mL/min/{1.73_m2} (ref >59)
GLOBULIN, TOTAL: 2.7 g/dL (ref 1.5–4.5)
Glucose: 85 mg/dL (ref 65–99)
POTASSIUM: 4 mmol/L (ref 3.5–5.2)
SODIUM: 136 mmol/L (ref 136–144)
Total Protein: 7.1 g/dL (ref 6.0–8.5)

## 2015-04-11 LAB — CBC WITH DIFFERENTIAL/PLATELET
BASOS: 0 %
Basophils Absolute: 0 10*3/uL (ref 0.0–0.2)
EOS (ABSOLUTE): 0.1 10*3/uL (ref 0.0–0.4)
Eos: 2 %
Hematocrit: 41.4 % (ref 34.0–46.6)
Hemoglobin: 14.2 g/dL (ref 11.1–15.9)
IMMATURE GRANULOCYTES: 0 %
Immature Grans (Abs): 0 10*3/uL (ref 0.0–0.1)
Lymphocytes Absolute: 2.6 10*3/uL (ref 0.7–3.1)
Lymphs: 34 %
MCH: 29.8 pg (ref 26.6–33.0)
MCHC: 34.3 g/dL (ref 31.5–35.7)
MCV: 87 fL (ref 79–97)
MONOS ABS: 0.5 10*3/uL (ref 0.1–0.9)
Monocytes: 7 %
NEUTROS PCT: 57 %
Neutrophils Absolute: 4.4 10*3/uL (ref 1.4–7.0)
PLATELETS: 329 10*3/uL (ref 150–379)
RBC: 4.76 x10E6/uL (ref 3.77–5.28)
RDW: 13.4 % (ref 12.3–15.4)
WBC: 7.7 10*3/uL (ref 3.4–10.8)

## 2015-04-11 LAB — H. PYLORI ANTIBODY, IGG: H Pylori IgG: <0.9 U/mL (ref 0.0–0.8)

## 2015-04-11 LAB — AMYLASE: AMYLASE: 54 U/L (ref 31–124)

## 2015-04-11 LAB — LIPASE: Lipase: 37 U/L (ref 0–59)

## 2015-04-11 NOTE — Addendum Note (Signed)
Addended by: Valerie Roys on: 04/11/2015 12:48 PM   Modules accepted: Orders

## 2015-04-12 LAB — H. PYLORI ANTIBODY, IGA

## 2015-04-16 ENCOUNTER — Ambulatory Visit: Payer: Self-pay | Admitting: Family Medicine

## 2015-04-16 ENCOUNTER — Telehealth: Payer: Self-pay | Admitting: Family Medicine

## 2015-04-16 ENCOUNTER — Telehealth: Payer: Self-pay

## 2015-04-16 DIAGNOSIS — D134 Benign neoplasm of liver: Secondary | ICD-10-CM | POA: Insufficient documentation

## 2015-04-16 DIAGNOSIS — R748 Abnormal levels of other serum enzymes: Secondary | ICD-10-CM | POA: Insufficient documentation

## 2015-04-16 NOTE — Telephone Encounter (Signed)
Patient would like to speak with Dr. Wynetta Emery regarding her lab results.  Patient understands that her liver function tests are elevated but still has concerns and would feel much better if she can speak with the doctor to put her mind at ease. Please call patient @ [phone].

## 2015-04-16 NOTE — Telephone Encounter (Signed)
Patient notified

## 2015-04-16 NOTE — Telephone Encounter (Signed)
Please let her know that her labs are stable. One of her liver functions is elevated, but it was before as well. We'll wait on the ultrasounds and see what they show. No sign of anything to worry about right now.

## 2015-04-16 NOTE — Telephone Encounter (Signed)
Called patient with results. She is very anxious about lab results. Tried to reassure patient. She got MRI report from surgeon which notes liver adenomas. She is concerned that these are not adenomas. Does not want to wait for ultrasound, which is due on Wednesday. Would like referral to see GI. Referral generated today. Await results of MRI to be dropped off and results of utlrasound to be done on Wednesday. Continue to monitor.

## 2015-04-17 ENCOUNTER — Other Ambulatory Visit: Payer: Self-pay | Admitting: Family Medicine

## 2015-04-17 DIAGNOSIS — R1032 Left lower quadrant pain: Secondary | ICD-10-CM

## 2015-04-18 ENCOUNTER — Ambulatory Visit
Admission: RE | Admit: 2015-04-18 | Discharge: 2015-04-18 | Disposition: A | Discharge status: Home / Self Care | Payer: BLUE CROSS/BLUE SHIELD | Source: Ambulatory Visit | Attending: Family Medicine | Admitting: Family Medicine

## 2015-04-18 DIAGNOSIS — R1032 Left lower quadrant pain: Secondary | ICD-10-CM | POA: Insufficient documentation

## 2015-04-19 ENCOUNTER — Emergency Department
Admission: EM | Admit: 2015-04-19 | Discharge: 2015-04-20 | Disposition: A | Discharge status: Home / Self Care | Payer: BLUE CROSS/BLUE SHIELD | Attending: Emergency Medicine | Admitting: Emergency Medicine

## 2015-04-19 ENCOUNTER — Emergency Department: Payer: BLUE CROSS/BLUE SHIELD

## 2015-04-19 ENCOUNTER — Encounter: Payer: Self-pay | Admitting: Family Medicine

## 2015-04-19 ENCOUNTER — Telehealth: Payer: Self-pay | Admitting: Family Medicine

## 2015-04-19 DIAGNOSIS — R0789 Other chest pain: Secondary | ICD-10-CM | POA: Insufficient documentation

## 2015-04-19 DIAGNOSIS — R590 Localized enlarged lymph nodes: Secondary | ICD-10-CM | POA: Insufficient documentation

## 2015-04-19 DIAGNOSIS — E282 Polycystic ovarian syndrome: Secondary | ICD-10-CM

## 2015-04-19 DIAGNOSIS — Z791 Long term (current) use of non-steroidal anti-inflammatories (NSAID): Secondary | ICD-10-CM | POA: Diagnosis not present

## 2015-04-19 DIAGNOSIS — F17200 Nicotine dependence, unspecified, uncomplicated: Secondary | ICD-10-CM | POA: Diagnosis not present

## 2015-04-19 DIAGNOSIS — R591 Generalized enlarged lymph nodes: Secondary | ICD-10-CM

## 2015-04-19 DIAGNOSIS — R079 Chest pain, unspecified: Secondary | ICD-10-CM | POA: Diagnosis present

## 2015-04-19 DIAGNOSIS — Z79899 Other long term (current) drug therapy: Secondary | ICD-10-CM | POA: Insufficient documentation

## 2015-04-19 DIAGNOSIS — R002 Palpitations: Secondary | ICD-10-CM | POA: Insufficient documentation

## 2015-04-19 LAB — BASIC METABOLIC PANEL
ANION GAP: 8 (ref 5–15)
BUN: 11 mg/dL (ref 6–20)
CALCIUM: 9.3 mg/dL (ref 8.9–10.3)
CO2: 27 mmol/L (ref 22–32)
Chloride: 101 mmol/L (ref 101–111)
Creatinine, Ser: 0.73 mg/dL (ref 0.44–1.00)
GFR calc Af Amer: >60 mL/min (ref >60)
GLUCOSE: 99 mg/dL (ref 65–99)
POTASSIUM: 3.7 mmol/L (ref 3.5–5.1)
SODIUM: 136 mmol/L (ref 135–145)

## 2015-04-19 LAB — CBC
HCT: 40.2 % (ref 35.0–47.0)
Hemoglobin: 13.7 g/dL (ref 12.0–16.0)
MCH: 29.6 pg (ref 26.0–34.0)
MCHC: 34.2 g/dL (ref 32.0–36.0)
MCV: 86.6 fL (ref 80.0–100.0)
PLATELETS: 289 10*3/uL (ref 150–440)
RBC: 4.64 MIL/uL (ref 3.80–5.20)
RDW: 13.4 % (ref 11.5–14.5)
WBC: 8.5 10*3/uL (ref 3.6–11.0)

## 2015-04-19 LAB — TROPONIN I

## 2015-04-19 NOTE — Telephone Encounter (Signed)
Patient is going to come pick up letter with information, she will schedule appointment when she comes in.

## 2015-04-19 NOTE — ED Provider Notes (Signed)
[facility] Emergency Department Provider Note  Time seen: 11:38 PM  I have reviewed the triage vital signs and the nursing notes.   HISTORY  Chief Complaint Chest Pain    HPI Jasmine Tran is a 34 y.o. female with a past medical history of bipolar, ADD, who presents the emergency department with chest pain 2-3 days, and a swollen area to her left neck. According to the patient she has noted for the past 2-3 days chest pain in her left chest. She describes it as sharp pain, worse when she pushes on the area or moves/twists. States she has had the pain before and they diagnosed her with costochondritis. She also notes today she felt a bump on the left side of her neck that was tender so she came to the emergency department for evaluation. Denies any nausea, diaphoresis, shortness of breath. Denies any leg pain or swelling. Describes the chest pain is mild.     Past Medical History  Diagnosis Date  . Bipolar 1 disorder with moderate mania ([place])   . ADD (attention deficit disorder)     Patient Active Problem List   Diagnosis Date Noted  . PCOS (polycystic ovarian syndrome) 04/19/2015  . Adenoma of liver 04/16/2015  . Elevated alkaline phosphatase level 04/16/2015  . Hematuria 03/30/2015  . Bipolar 1 disorder with moderate mania ([place])   . ADD (attention deficit disorder)     Past Surgical History  Procedure Laterality Date  . Appendectomy    . Cholecystectomy      Current Outpatient Rx  Name  Route  Sig  Dispense  Refill  . cyclobenzaprine (FLEXERIL) 10 MG tablet   Oral   Take 1 tablet (10 mg total) by mouth 3 (three) times daily as needed for muscle spasms.   30 tablet   0   . EPINEPHrine (EPIPEN 2-PAK) 0.3 mg/0.3 mL IJ SOAJ injection   Intramuscular   Inject into the muscle.         . L-methylfolate Calcium 7.5 MG TABS   Oral   Take 15 mg by mouth daily.         Marland Kitchen lamoTRIgine (LAMICTAL) 200 MG tablet   Oral   Take 200 mg by mouth  daily.         Marland Kitchen lisdexamfetamine (VYVANSE) 70 MG capsule   Oral   Take 70 mg by mouth daily.         . meloxicam (MOBIC) 15 MG tablet   Oral   Take 1 tablet (15 mg total) by mouth daily.   30 tablet   1   . oxcarbazepine (TRILEPTAL) 600 MG tablet   Oral   Take 600 mg by mouth 2 (two) times daily.           Allergies Doxycycline; Methylprednisolone; Morphine; Morphine and related; Phenergan; Promethazine; and Sertraline  Family History  Problem Relation Age of Onset  . Heart disease Mother   . Heart disease Father   . Cancer Neg Hx   . COPD Neg Hx   . Diabetes Neg Hx   . Hypertension Neg Hx   . Stroke Neg Hx     Social History Social History  Substance Use Topics  . Smoking status: Current Some Day Smoker  . Smokeless tobacco: Never Used  . Alcohol Use: No    Review of Systems Constitutional: Negative for fever. Cardiovascular: Mild chest pain, worse with palpation Respiratory: Negative for shortness of breath. Gastrointestinal: Negative for abdominal pain Musculoskeletal: Negative  for back pain Neurological: Negative for headache 10-point ROS otherwise negative.  ____________________________________________   PHYSICAL EXAM:  VITAL SIGNS: ED Triage Vitals  Enc Vitals Group     BP 04/19/15 2222 129/82 mmHg     Pulse Rate 04/19/15 2222 90     Resp 04/19/15 2222 18     Temp 04/19/15 2222 98.1 F (36.7 C)     Temp Source 04/19/15 2222 Oral     SpO2 04/19/15 2222 99 %     Weight 04/19/15 2222 200 lb (90.719 kg)     Height 04/19/15 2222 5\' 2"  (1.575 m)     Head Cir --      Peak Flow --      Pain Score 04/19/15 2222 7     Pain Loc --      Pain Edu? --      Excl. in [place]? --     Constitutional: Alert and oriented. Well appearing and in no distress. Eyes: Normal exam ENT   Head: Normocephalic and atraumatic. Mild/moderate left anterior cervical lymphadenopathy noted. Mild to moderate tenderness to palpation of the area. Mild right cervical  anterior lymphadenopathy as well.   Mouth/Throat: Mucous membranes are moist. Nonerythematous pharynx. Cardiovascular: Normal rate, regular rhythm. No murmur Respiratory: Normal respiratory effort without tachypnea nor retractions. Breath sounds are clear Gastrointestinal: Soft and nontender. No distention.   Musculoskeletal: Nontender with normal range of motion in all extremities. No lower extremity tenderness or edema. Neurologic:  Normal speech and language. No gross focal neurologic deficits  Skin:  Skin is warm, dry and intact.  Psychiatric: Mood and affect are normal. Speech and behavior are normal.  ____________________________________________    EKG  EKG reviewed and interpreted by myself shows normal sinus rhythm at 87 bpm, narrow QRS, normal axis, normal intervals, no ST changes. Normal EKG.  ____________________________________________    RADIOLOGY  Chest x-ray negative  ____________________________________________   INITIAL IMPRESSION / ASSESSMENT AND PLAN / ED COURSE  Pertinent labs & imaging results that were available during my care of the patient were reviewed by me and considered in my medical decision making (see chart for details).  Patient presented to the emergency department with chest pain 2-3 days, worse with palpation, movement or twisting. She also states today she noticed a lump on the left side of her neck with moderate tenderness to palpation. Denies any recent cough, congestion, sore throat, ear pain. Lymph node most consistent with reactive anterior cervical lymphadenopathy. I discussed with the patient and need to closely monitor, and to follow-up with ENT if the lymph node has not resolved within 2 weeks. Patient is agreeable to this plan. Patient's chest pain is extremely reproducible, tender to palpation. Labs are normal including negative cardiac enzyme, EKG is normal. We'll obtain a chest x-ray. I discussed with the patient and since for  symptom relief. Patient is agreeable and will follow up with cardiology for a stress test.  Chest x-ray negative. We'll discharge home with cardiology follow-up for stress test. I discussed use of NSAIDs for likely chest wall pain. Patient agreeable.  ____________________________________________   FINAL CLINICAL IMPRESSION(S) / ED DIAGNOSES  Left chest wall pain Cervical lymphadenopathy   Harvest Dark, MD 04/20/15 0040

## 2015-04-19 NOTE — Telephone Encounter (Signed)
Can you please get patient in for an appointment early next week to discuss results. Please let her know that it is nothing to be worried about right now, you can tell her that it shows PCOS and send her information about that. I would like to discuss the diagnosis further with her and discuss treatment options with her, which is why I'd like to see her. There is nothing on the [place] that shows any bad, just a lot of cysts on her ovaries, which may be the cause of her pain. There is a letter generated in her chart with the results and the information of PCOS if you could print that and send it so hopefully she'll get it before I see her early next week. Thanks!

## 2015-04-19 NOTE — ED Notes (Signed)
Patient transported to X-ray 

## 2015-04-19 NOTE — ED Notes (Signed)
Pt in with co "swollen are to left neck" that is tender to touch, also co chest pain.  Denies any recent illness, no distress noted.

## 2015-04-20 ENCOUNTER — Encounter: Payer: BLUE CROSS/BLUE SHIELD | Admitting: Family Medicine

## 2015-04-20 MED ORDER — IBUPROFEN 600 MG PO TABS: 600.0000 mg | ORAL_TABLET | Freq: Four times a day (QID) | ORAL | Status: DC | PRN

## 2015-04-20 NOTE — Discharge Instructions (Signed)
You have been seen in the emergency department for chest pain. As we discussed her workup showed normal results. Please take Motrin 600 mg every 8 hours for the next 7 days as needed for chest discomfort. Please follow-up with cardiology by calling the number provided to discuss further treatment/evaluation such as a stress test. Please monitor the lymph node on your neck, if this does not improve within the next 2 weeks please call ENT for a follow-up appointment, by calling the number provided.    Chest Wall Pain Chest wall pain is pain in or around the bones and muscles of your chest. Sometimes, an injury causes this pain. Sometimes, the cause may not be known. This pain may take several weeks or longer to get better. HOME CARE INSTRUCTIONS  Pay attention to any changes in your symptoms. Take these actions to help with your pain:   Rest as told by your health care provider.   Avoid activities that cause pain. These include any activities that use your chest muscles or your abdominal and side muscles to lift heavy items.   If directed, apply ice to the painful area:  Put ice in a plastic bag.  Place a towel between your skin and the bag.  Leave the ice on for 20 minutes, 2-3 times per day.  Take over-the-counter and prescription medicines only as told by your health care provider.  Do not use tobacco products, including cigarettes, chewing tobacco, and e-cigarettes. If you need help quitting, ask your health care provider.  Keep all follow-up visits as told by your health care provider. This is important. SEEK MEDICAL CARE IF:  You have a fever.  Your chest pain becomes worse.  You have new symptoms. SEEK IMMEDIATE MEDICAL CARE IF:  You have nausea or vomiting.  You feel sweaty or light-headed.  You have a cough with phlegm (sputum) or you cough up blood.  You develop shortness of breath.   This information is not intended to replace advice given to you by your health  care provider. Make sure you discuss any questions you have with your health care provider.   Document Released: 05/05/2005 Document Revised: 01/24/2015 Document Reviewed: 07/31/2014 Elsevier Interactive Patient Education 2016 [employer]  Lymphadenopathy Lymphadenopathy refers to swollen or enlarged lymph glands, also called lymph nodes. Lymph glands are part of your body's defense (immune) system, which protects the body from infections, germs, and diseases. Lymph glands are found in many locations in your body, including the neck, underarm, and groin.  Many things can cause lymph glands to become enlarged. When your immune system responds to germs, such as viruses or bacteria, infection-fighting cells and fluid build up. This causes the glands to grow in size. Usually, this is not something to worry about. The swelling and any soreness often go away without treatment. However, swollen lymph glands can also be caused by a number of diseases. Your health care provider may do various tests to help determine the cause. If the cause of your swollen lymph glands cannot be found, it is important to monitor your condition to make sure the swelling goes away. HOME CARE INSTRUCTIONS Watch your condition for any changes. The following actions may help to lessen any discomfort you are feeling:  Get plenty of rest.  Take medicines only as directed by your health care provider. Your health care provider may recommend over-the-counter medicines for pain.  Apply moist heat compresses to the site of swollen lymph nodes as directed by your health  care provider. This can help reduce any pain.  Check your lymph nodes daily for any changes.  Keep all follow-up visits as directed by your health care provider. This is important. SEEK MEDICAL CARE IF:  Your lymph nodes are still swollen after 2 weeks.  Your swelling increases or spreads to other areas.  Your lymph nodes are hard, seem fixed to the skin, or  are growing rapidly.  Your skin over the lymph nodes is red and inflamed.  You have a fever.  You have chills.  You have fatigue.  You develop a sore throat.  You have abdominal pain.  You have weight loss.  You have night sweats. SEEK IMMEDIATE MEDICAL CARE IF:  You notice fluid leaking from the area of the enlarged lymph node.  You have severe pain in any area of your body.  You have chest pain.  You have shortness of breath.   This information is not intended to replace advice given to you by your health care provider. Make sure you discuss any questions you have with your health care provider.   Document Released: 02/12/2008 Document Revised: 05/26/2014 Document Reviewed: 12/08/2013 Elsevier Interactive Patient Education [employer].

## 2015-04-23 ENCOUNTER — Ambulatory Visit: Payer: BLUE CROSS/BLUE SHIELD | Admitting: Family Medicine

## 2015-04-23 ENCOUNTER — Encounter: Payer: Self-pay | Admitting: Family Medicine

## 2015-04-24 ENCOUNTER — Encounter: Payer: Self-pay | Admitting: Family Medicine

## 2015-04-24 ENCOUNTER — Ambulatory Visit (INDEPENDENT_AMBULATORY_CARE_PROVIDER_SITE_OTHER): Payer: BLUE CROSS/BLUE SHIELD | Admitting: Family Medicine

## 2015-04-24 ENCOUNTER — Other Ambulatory Visit: Payer: Self-pay | Admitting: Family Medicine

## 2015-04-24 VITALS — BP 116/82 | HR 70 | Temp 98.1°F | Ht 61.6 in | Wt 201.0 lb

## 2015-04-24 DIAGNOSIS — F3112 Bipolar disorder, current episode manic without psychotic features, moderate: Secondary | ICD-10-CM

## 2015-04-24 DIAGNOSIS — Z79899 Other long term (current) drug therapy: Secondary | ICD-10-CM | POA: Diagnosis not present

## 2015-04-24 DIAGNOSIS — R748 Abnormal levels of other serum enzymes: Secondary | ICD-10-CM | POA: Diagnosis not present

## 2015-04-24 DIAGNOSIS — E282 Polycystic ovarian syndrome: Secondary | ICD-10-CM

## 2015-04-24 DIAGNOSIS — D134 Benign neoplasm of liver: Secondary | ICD-10-CM

## 2015-04-24 DIAGNOSIS — R590 Localized enlarged lymph nodes: Secondary | ICD-10-CM

## 2015-04-24 DIAGNOSIS — N644 Mastodynia: Secondary | ICD-10-CM | POA: Diagnosis not present

## 2015-04-24 DIAGNOSIS — R599 Enlarged lymph nodes, unspecified: Secondary | ICD-10-CM | POA: Diagnosis not present

## 2015-04-24 DIAGNOSIS — F988 Other specified behavioral and emotional disorders with onset usually occurring in childhood and adolescence: Secondary | ICD-10-CM

## 2015-04-24 DIAGNOSIS — F909 Attention-deficit hyperactivity disorder, unspecified type: Secondary | ICD-10-CM | POA: Diagnosis not present

## 2015-04-24 MED ORDER — L-METHYLFOLATE CALCIUM 7.5 MG PO TABS: 15.0000 mg | ORAL_TABLET | Freq: Every day | ORAL | Status: DC

## 2015-04-24 MED ORDER — OXCARBAZEPINE 600 MG PO TABS: 600.0000 mg | ORAL_TABLET | Freq: Two times a day (BID) | ORAL | Status: DC

## 2015-04-24 MED ORDER — LISDEXAMFETAMINE DIMESYLATE 60 MG PO CAPS
60.0000 mg | ORAL_CAPSULE | Freq: Every day | ORAL | Status: DC
Start: 2015-04-24 — End: 2015-05-24

## 2015-04-24 MED ORDER — LAMOTRIGINE 200 MG PO TABS: 200.0000 mg | ORAL_TABLET | Freq: Every day | ORAL | Status: DC

## 2015-04-24 NOTE — Assessment & Plan Note (Signed)
Has been stable. Meds written today. Will have to go back to psych if unstable again. Continue to monitor.

## 2015-04-24 NOTE — Assessment & Plan Note (Signed)
Will decrease dose to 60mg  to see if that helps with the breast pain. Stable on current regimen. Notes from psychiatry reviewed. Continue current regimen. Continue to monitor. Controlled substance agreement signed today.

## 2015-04-24 NOTE — Progress Notes (Signed)
BP 116/82 mmHg  Pulse 70  Temp(Src) 98.1 F (36.7 C)  Ht 5' 1.6" (1.565 m)  Wt 201 lb (91.173 kg)  BMI 37.23 kg/m2  SpO2 99%  LMP 03/05/2015   Subjective:    Patient ID: Jasmine Tran, female    DOB: [birth year], 34 y.o.   MRN: [id number]  HPI: Jasmine Tran is a 34 y.o. female  Chief Complaint  Patient presents with  . Breast Pain  . Results   Very concerned about her breast pain. Flexeril did not help. Will not be satisfied without imaging. Very concerned something is going on in the breast. Wants a mammogram.   Lump Has had a lump in her neck for the past 2 months. Seems like it's getting bigger. Has had normal CBCs. Concerned about what it could be.  Duration: 2 months Location: L lateral neck Painful: yes Itching: no Onset: sudden Context: bigger Associated signs and symptoms: tender if she touches on it, seems to be more inflammed History of skin cancer: no History of precancerous skin lesions: no  Has not had a period since she came off OCP in October. She notes that she has been on OCP for a very long time. Does not remember having abnormal periods around that time. Does not have excess hair growth. Does note that people have told her in the past that she had ovarian cysts. Does not plan on having children.   ADHD FOLLOW UP ADHD status: controlled Satisfied with current therapy: no- is wondering if the 70mg  of vyvanse is causing her chest pain Medication compliance:  excellent compliance Controlled substance contract: yes Previous psychiatry evaluation: yes Previous medications: yes adderall and vyvanse (lisdexamfethamine)   Taking meds on weekends/vacations: occasionally Work/school performance:  excellent Difficulty sustaining attention/completing tasks: no Distracted by extraneous stimuli: no Does not listen when spoken to: no  Fidgets with hands or feet: no Unable to stay in seat: no Blurts out/interrupts others: no ADHD Medication Side Effects: no  Decreased appetite: no    Headache: no    Sleeping disturbance pattern: no    Irritability: no    Rebound effects (worse than baseline) off medication: no    Anxiousness: no    Dizziness: no    Tics: no    Chest pain: Yes  Relevant past medical, surgical, family and social history reviewed and updated as indicated. Interim medical history since our last visit reviewed. Allergies and medications reviewed and updated.  Review of Systems  Constitutional: Negative.   HENT: Negative.   Respiratory: Negative.   Cardiovascular: Negative.   Gastrointestinal: Positive for nausea, abdominal pain and abdominal distention. Negative for vomiting, diarrhea, constipation, blood in stool, anal bleeding and rectal pain.  Musculoskeletal: Negative.   Psychiatric/Behavioral: Negative.     Per HPI unless specifically indicated above     Objective:    BP 116/82 mmHg  Pulse 70  Temp(Src) 98.1 F (36.7 C)  Ht 5' 1.6" (1.565 m)  Wt 201 lb (91.173 kg)  BMI 37.23 kg/m2  SpO2 99%  LMP 03/05/2015  Wt Readings from Last 3 Encounters:  04/24/15 201 lb (91.173 kg)  04/19/15 200 lb (90.719 kg)  04/10/15 202 lb (91.627 kg)    Physical Exam  Constitutional: She is oriented to person, place, and time. She appears well-developed and well-nourished. No distress.  HENT:  Head: Normocephalic and atraumatic.  Right Ear: Hearing normal.  Left Ear: Hearing normal.  Nose: Nose normal.  Eyes: Conjunctivae and lids are normal. Right eye exhibits no  discharge. Left eye exhibits no discharge. No scleral icterus.  Pulmonary/Chest: Effort normal. No respiratory distress.  Musculoskeletal: Normal range of motion.  Lymphadenopathy:       Head (right side): No submental, no submandibular, no tonsillar, no preauricular, no posterior auricular and no occipital adenopathy present.       Head (left side): No submental, no submandibular, no tonsillar, no preauricular, no posterior auricular and no occipital adenopathy  present.    She has cervical adenopathy.       Right cervical: No superficial cervical, no deep cervical and no posterior cervical adenopathy present.      Left cervical: Superficial cervical adenopathy present. No deep cervical and no posterior cervical adenopathy present.    She has no axillary adenopathy.       Right: No inguinal, no supraclavicular and no epitrochlear adenopathy present.       Left: No inguinal, no supraclavicular and no epitrochlear adenopathy present.  2cm firm, mobile swollen lymph node lateral to the angle of the L manidble  Neurological: She is alert and oriented to person, place, and time.  Skin: Skin is intact. No rash noted.  Psychiatric: She has a normal mood and affect. Her speech is normal and behavior is normal. Judgment and thought content normal. Cognition and memory are normal.    Results for orders placed or performed during the hospital encounter of 04/19/15  CBC  Result Value Ref Range   WBC 8.5 3.6 - 11.0 K/uL   RBC 4.64 3.80 - 5.20 MIL/uL   Hemoglobin 13.7 12.0 - 16.0 g/dL   HCT 40.2 35.0 - 47.0 %   MCV 86.6 80.0 - 100.0 fL   MCH 29.6 26.0 - 34.0 pg   MCHC 34.2 32.0 - 36.0 g/dL   RDW 13.4 11.5 - 14.5 %   Platelets 289 150 - 440 K/uL  Basic metabolic panel  Result Value Ref Range   Sodium 136 135 - 145 mmol/L   Potassium 3.7 3.5 - 5.1 mmol/L   Chloride 101 101 - 111 mmol/L   CO2 27 22 - 32 mmol/L   Glucose, Bld 99 65 - 99 mg/dL   BUN 11 6 - 20 mg/dL   Creatinine, Ser 0.73 0.44 - 1.00 mg/dL   Calcium 9.3 8.9 - 10.3 mg/dL   GFR calc non Af Amer >60 >60 mL/min   GFR calc Af Amer >60 >60 mL/min   Anion gap 8 5 - 15  Troponin I  Result Value Ref Range   Troponin I <0.03 <0.031 ng/mL      Assessment & Plan:   Problem List Items Addressed This Visit      Digestive   Adenoma of liver    Would like to see GI. Will work on trying to get her in somewhere earlier as she is very anxious and doesn't want to wait until the end of January to  see someone. Rechecking CMP today.      Relevant Orders   Comprehensive metabolic panel     Endocrine   PCOS (polycystic ovarian syndrome) - Primary    Checking labs today to confirm. Will await GI consult to determine if OK to go back on her OCP. No need for spironalactone at this time as no hirsuitism. No need for metformin as glucose has been good.       Relevant Orders   Comprehensive metabolic panel     Other   Bipolar 1 disorder with moderate mania (HCC)    Has been  stable. Meds written today. Will have to go back to psych if unstable again. Continue to monitor.       ADD (attention deficit disorder)    Will decrease dose to 60mg  to see if that helps with the breast pain. Stable on current regimen. Notes from psychiatry reviewed. Continue current regimen. Continue to monitor. Controlled substance agreement signed today.      Elevated alkaline phosphatase level    Would like to see GI. Will work on trying to get her in somewhere earlier as she is very anxious and doesn't want to wait until the end of January to see someone. Rechecking CMP today.       Other Visit Diagnoses    Controlled substance agreement signed        04/24/15, on file for vyvanse.     Lymphadenopathy of left cervical region        Will obtain ultrasound of lump to determine origin. CBCs have been normal. Conitnue to monitor.     Relevant Orders    US Soft Tissue Head/Neck    Comprehensive metabolic panel    Breast pain        Will get ultrasound of L breast. May need to move on to MRI if it doesn't show anything. Continue to monitor.     Relevant Orders    US BREAST COMPLETE UNI LEFT INC AXILLA    Comprehensive metabolic panel        Follow up plan: Return in about 4 weeks (around 05/22/2015) for ADD follow up/Breast Pain/PCOS.

## 2015-04-24 NOTE — Assessment & Plan Note (Signed)
Would like to see GI. Will work on trying to get her in somewhere earlier as she is very anxious and doesn't want to wait until the end of January to see someone. Rechecking CMP today.

## 2015-04-24 NOTE — Assessment & Plan Note (Signed)
Checking labs today to confirm. Will await GI consult to determine if OK to go back on her OCP. No need for spironalactone at this time as no hirsuitism. No need for metformin as glucose has been good.

## 2015-04-25 LAB — COMPREHENSIVE METABOLIC PANEL
A/G RATIO: 1.6 (ref 1.1–2.5)
ALK PHOS: 237 IU/L — AB (ref 39–117)
ALK PHOS: 242 IU/L — AB (ref 39–117)
ALT: 62 IU/L — ABNORMAL HIGH (ref 0–32)
ALT: 62 IU/L — ABNORMAL HIGH (ref 0–32)
AST: 33 IU/L (ref 0–40)
AST: 36 IU/L (ref 0–40)
Albumin/Globulin Ratio: 1.6 (ref 1.1–2.5)
Albumin: 4.6 g/dL (ref 3.5–5.5)
Albumin: 4.7 g/dL (ref 3.5–5.5)
BUN/Creatinine Ratio: 8 (ref 8–20)
BUN/Creatinine Ratio: 9 (ref 8–20)
BUN: 6 mg/dL (ref 6–20)
BUN: 6 mg/dL (ref 6–20)
Bilirubin Total: 0.2 mg/dL (ref 0.0–1.2)
Bilirubin Total: 0.2 mg/dL (ref 0.0–1.2)
CO2: 22 mmol/L (ref 18–29)
CO2: 22 mmol/L (ref 18–29)
CREATININE: 0.75 mg/dL (ref 0.57–1.00)
Calcium: 10.1 mg/dL (ref 8.7–10.2)
Calcium: 10 mg/dL (ref 8.7–10.2)
Chloride: 101 mmol/L (ref 97–106)
Chloride: 99 mmol/L (ref 97–106)
Creatinine, Ser: 0.66 mg/dL (ref 0.57–1.00)
GFR calc Af Amer: 121 mL/min/{1.73_m2} (ref >59)
GFR calc Af Amer: 134 mL/min/{1.73_m2} (ref >59)
GFR calc non Af Amer: 105 mL/min/{1.73_m2} (ref >59)
GFR, EST NON AFRICAN AMERICAN: 116 mL/min/{1.73_m2} (ref >59)
GLOBULIN, TOTAL: 2.8 g/dL (ref 1.5–4.5)
GLOBULIN, TOTAL: 2.9 g/dL (ref 1.5–4.5)
GLUCOSE: 83 mg/dL (ref 65–99)
Glucose: 88 mg/dL (ref 65–99)
POTASSIUM: 4.5 mmol/L (ref 3.5–5.2)
Potassium: 4.5 mmol/L (ref 3.5–5.2)
SODIUM: 139 mmol/L (ref 136–144)
SODIUM: 139 mmol/L (ref 136–144)
Total Protein: 7.4 g/dL (ref 6.0–8.5)
Total Protein: 7.6 g/dL (ref 6.0–8.5)

## 2015-04-25 LAB — PROLACTIN: Prolactin: 18.2 ng/mL (ref 4.8–23.3)

## 2015-04-25 LAB — THYROID PANEL WITH TSH
FREE THYROXINE INDEX: 2 (ref 1.2–4.9)
T3 UPTAKE RATIO: 25 % (ref 24–39)
T4 TOTAL: 7.8 ug/dL (ref 4.5–12.0)
TSH: 2.8 u[IU]/mL (ref 0.450–4.500)

## 2015-04-25 LAB — CORTISOL: Cortisol: 15.9 ug/dL

## 2015-04-25 LAB — LUTEINIZING HORMONE: LH: 12.3 m[IU]/mL

## 2015-04-25 LAB — FOLLICLE STIMULATING HORMONE: FSH: 5.5 m[IU]/mL

## 2015-04-26 LAB — TESTOSTERONE, FREE, TOTAL, SHBG: TESTOSTERONE FREE: 8.5 pg/mL — AB (ref 0.0–4.2)

## 2015-04-26 LAB — INSULIN-LIKE GROWTH FACTOR: Insulin-Like GF-1: 167 ng/mL (ref 73–243)

## 2015-04-27 ENCOUNTER — Telehealth: Payer: Self-pay | Admitting: Family Medicine

## 2015-04-27 ENCOUNTER — Encounter: Payer: Self-pay | Admitting: Family Medicine

## 2015-04-27 LAB — DHEA: Dehydroepiandrosterone: 359 ng/dL (ref 31–701)

## 2015-04-27 NOTE — Telephone Encounter (Signed)
Called with the results of her blood work. She is aware.

## 2015-04-28 LAB — 17-HYDROXYPROGESTERONE: 17-Hydroxyprogesterone: 113 ng/dL

## 2015-05-01 ENCOUNTER — Telehealth: Payer: Self-pay

## 2015-05-01 ENCOUNTER — Ambulatory Visit
Admission: RE | Admit: 2015-05-01 | Discharge: 2015-05-01 | Disposition: A | Discharge status: Home / Self Care | Payer: BLUE CROSS/BLUE SHIELD | Source: Ambulatory Visit | Attending: Family Medicine | Admitting: Family Medicine

## 2015-05-01 ENCOUNTER — Encounter: Payer: BLUE CROSS/BLUE SHIELD | Admitting: Family Medicine

## 2015-05-01 ENCOUNTER — Ambulatory Visit: Payer: BLUE CROSS/BLUE SHIELD

## 2015-05-01 DIAGNOSIS — N644 Mastodynia: Secondary | ICD-10-CM

## 2015-05-01 DIAGNOSIS — R590 Localized enlarged lymph nodes: Secondary | ICD-10-CM | POA: Diagnosis not present

## 2015-05-01 NOTE — Telephone Encounter (Signed)
Orders placed, no answer at [facility] to schedule appointment, will try again.

## 2015-05-01 NOTE — Telephone Encounter (Signed)
Called and spoke with patient, will place order in for ultrasound of breast at the hospital

## 2015-05-02 ENCOUNTER — Telehealth: Payer: Self-pay | Admitting: Family Medicine

## 2015-05-02 NOTE — Telephone Encounter (Signed)
The ultrasound shows you do have some swollen lymph nodes, but they may be reactive-- meaning they're acting up from allergies, muscle spasm, irritation or something like that. They looked like normal lymph nodes, we have 2 options here. We can wait and see if they get better with the change in the season and with some of the other things getting taken care of with you, or we can get you in to see the surgeon and talk about the option of a biopsy. I don't think you need one right now, and I think you'd be fine waiting a bit to see if they go away or get bigger, but if they are going to be pressing on your mind and making you worry, I'm happy to do this. Let me know what you want to do and we'll have a plan. I'll let you know as soon as I hear about your breast ultrasound.

## 2015-05-09 ENCOUNTER — Ambulatory Visit
Admission: RE | Admit: 2015-05-09 | Discharge: 2015-05-09 | Disposition: A | Discharge status: Home / Self Care | Payer: BLUE CROSS/BLUE SHIELD | Source: Ambulatory Visit | Attending: Family Medicine | Admitting: Family Medicine

## 2015-05-09 ENCOUNTER — Encounter: Payer: Self-pay | Admitting: Family Medicine

## 2015-05-09 DIAGNOSIS — N644 Mastodynia: Secondary | ICD-10-CM

## 2015-05-15 ENCOUNTER — Encounter: Payer: Self-pay | Admitting: Family Medicine

## 2015-05-15 ENCOUNTER — Ambulatory Visit (INDEPENDENT_AMBULATORY_CARE_PROVIDER_SITE_OTHER): Payer: BLUE CROSS/BLUE SHIELD | Admitting: Family Medicine

## 2015-05-15 VITALS — BP 125/83 | HR 93 | Temp 98.0°F | Ht 62.0 in | Wt 201.0 lb

## 2015-05-15 DIAGNOSIS — R319 Hematuria, unspecified: Secondary | ICD-10-CM | POA: Diagnosis not present

## 2015-05-15 DIAGNOSIS — Z0000 Encounter for general adult medical examination without abnormal findings: Secondary | ICD-10-CM

## 2015-05-15 DIAGNOSIS — D134 Benign neoplasm of liver: Secondary | ICD-10-CM | POA: Diagnosis not present

## 2015-05-15 LAB — MICROSCOPIC EXAMINATION: WBC, UA: NONE SEEN /hpf (ref 0–?)

## 2015-05-15 LAB — LIPID PANEL PICCOLO, WAIVED
CHOLESTEROL PICCOLO, WAIVED: 190 mg/dL (ref <200)
Chol/HDL Ratio Piccolo,Waive: 3.6 mg/dL
HDL Chol Piccolo, Waived: 53 mg/dL — ABNORMAL LOW (ref >59)
LDL Chol Calc Piccolo Waived: 111 mg/dL — ABNORMAL HIGH (ref <100)
Triglycerides Piccolo,Waived: 131 mg/dL (ref <150)
VLDL Chol Calc Piccolo,Waive: 26 mg/dL (ref <30)

## 2015-05-15 LAB — UA/M W/RFLX CULTURE, ROUTINE
Bilirubin, UA: NEGATIVE
Glucose, UA: NEGATIVE
KETONES UA: NEGATIVE
LEUKOCYTES UA: NEGATIVE
Nitrite, UA: NEGATIVE
Protein, UA: NEGATIVE
SPEC GRAV UA: 1.02 (ref 1.005–1.030)
Urobilinogen, Ur: 0.2 mg/dL (ref 0.2–1.0)
pH, UA: 5 (ref 5.0–7.5)

## 2015-05-15 NOTE — Patient Instructions (Signed)
Health Maintenance, Female Adopting a healthy lifestyle and getting preventive care can go a long way to promote health and wellness. Talk with your health care provider about what schedule of regular examinations is right for you. This is a good chance for you to check in with your provider about disease prevention and staying healthy. In between checkups, there are plenty of things you can do on your own. Experts have done a lot of research about which lifestyle changes and preventive measures are most likely to keep you healthy. Ask your health care provider for more information. WEIGHT AND DIET  Eat a healthy diet  Be sure to include plenty of vegetables, fruits, low-fat dairy products, and lean protein.  Do not eat a lot of foods high in solid fats, added sugars, or salt.  Get regular exercise. This is one of the most important things you can do for your health.  Most adults should exercise for at least 150 minutes each week. The exercise should increase your heart rate and make you sweat (moderate-intensity exercise).  Most adults should also do strengthening exercises at least twice a week. This is in addition to the moderate-intensity exercise.  Maintain a healthy weight  Body mass index (BMI) is a measurement that can be used to identify possible weight problems. It estimates body fat based on height and weight. Your health care provider can help determine your BMI and help you achieve or maintain a healthy weight.  For females 20 years of age and older:   A BMI below 18.5 is considered underweight.  A BMI of 18.5 to 24.9 is normal.  A BMI of 25 to 29.9 is considered overweight.  A BMI of 30 and above is considered obese.  Watch levels of cholesterol and blood lipids  You should start having your blood tested for lipids and cholesterol at 33 years of age, then have this test every 5 years.  You may need to have your cholesterol levels checked more often if:  Your lipid  or cholesterol levels are high.  You are older than 33 years of age.  You are at high risk for heart disease.  CANCER SCREENING   Lung Cancer  Lung cancer screening is recommended for adults 55-80 years old who are at high risk for lung cancer because of a history of smoking.  A yearly low-dose CT scan of the lungs is recommended for people who:  Currently smoke.  Have quit within the past 15 years.  Have at least a 30-pack-year history of smoking. A pack year is smoking an average of one pack of cigarettes a day for 1 year.  Yearly screening should continue until it has been 15 years since you quit.  Yearly screening should stop if you develop a health problem that would prevent you from having lung cancer treatment.  Breast Cancer  Practice breast self-awareness. This means understanding how your breasts normally appear and feel.  It also means doing regular breast self-exams. Let your health care provider know about any changes, no matter how small.  If you are in your 20s or 30s, you should have a clinical breast exam (CBE) by a health care provider every 1-3 years as part of a regular health exam.  If you are 40 or older, have a CBE every year. Also consider having a breast X-ray (mammogram) every year.  If you have a family history of breast cancer, talk to your health care provider about genetic screening.  If you   are at high risk for breast cancer, talk to your health care provider about having an MRI and a mammogram every year.  Breast cancer gene (BRCA) assessment is recommended for women who have family members with BRCA-related cancers. BRCA-related cancers include:  Breast.  Ovarian.  Tubal.  Peritoneal cancers.  Results of the assessment will determine the need for genetic counseling and BRCA1 and BRCA2 testing. Cervical Cancer Your health care provider may recommend that you be screened regularly for cancer of the pelvic organs (ovaries, uterus, and  vagina). This screening involves a pelvic examination, including checking for microscopic changes to the surface of your cervix (Pap test). You may be encouraged to have this screening done every 3 years, beginning at age 21.  For women ages 30-65, health care providers may recommend pelvic exams and Pap testing every 3 years, or they may recommend the Pap and pelvic exam, combined with testing for human papilloma virus (HPV), every 5 years. Some types of HPV increase your risk of cervical cancer. Testing for HPV may also be done on women of any age with unclear Pap test results.  Other health care providers may not recommend any screening for nonpregnant women who are considered low risk for pelvic cancer and who do not have symptoms. Ask your health care provider if a screening pelvic exam is right for you.  If you have had past treatment for cervical cancer or a condition that could lead to cancer, you need Pap tests and screening for cancer for at least 20 years after your treatment. If Pap tests have been discontinued, your risk factors (such as having a new sexual partner) need to be reassessed to determine if screening should resume. Some women have medical problems that increase the chance of getting cervical cancer. In these cases, your health care provider may recommend more frequent screening and Pap tests. Colorectal Cancer  This type of cancer can be detected and often prevented.  Routine colorectal cancer screening usually begins at 33 years of age and continues through 33 years of age.  Your health care provider may recommend screening at an earlier age if you have risk factors for colon cancer.  Your health care provider may also recommend using home test kits to check for hidden blood in the stool.  A small camera at the end of a tube can be used to examine your colon directly (sigmoidoscopy or colonoscopy). This is done to check for the earliest forms of colorectal  cancer.  Routine screening usually begins at age 50.  Direct examination of the colon should be repeated every 5-10 years through 33 years of age. However, you may need to be screened more often if early forms of precancerous polyps or small growths are found. Skin Cancer  Check your skin from head to toe regularly.  Tell your health care provider about any new moles or changes in moles, especially if there is a change in a mole's shape or color.  Also tell your health care provider if you have a mole that is larger than the size of a pencil eraser.  Always use sunscreen. Apply sunscreen liberally and repeatedly throughout the day.  Protect yourself by wearing long sleeves, pants, a wide-brimmed hat, and sunglasses whenever you are outside. HEART DISEASE, DIABETES, AND HIGH BLOOD PRESSURE   High blood pressure causes heart disease and increases the risk of stroke. High blood pressure is more likely to develop in:  People who have blood pressure in the high end   of the normal range (130-139/85-89 mm Hg).  People who are overweight or obese.  People who are African American.  If you are 38-23 years of age, have your blood pressure checked every 3-5 years. If you are 61 years of age or older, have your blood pressure checked every year. You should have your blood pressure measured twice--once when you are at a hospital or clinic, and once when you are not at a hospital or clinic. Record the average of the two measurements. To check your blood pressure when you are not at a hospital or clinic, you can use:  An automated blood pressure machine at a pharmacy.  A home blood pressure monitor.  If you are between 45 years and 39 years old, ask your health care provider if you should take aspirin to prevent strokes.  Have regular diabetes screenings. This involves taking a blood sample to check your fasting blood sugar level.  If you are at a normal weight and have a low risk for diabetes,  have this test once every three years after 33 years of age.  If you are overweight and have a high risk for diabetes, consider being tested at a younger age or more often. PREVENTING INFECTION  Hepatitis B  If you have a higher risk for hepatitis B, you should be screened for this virus. You are considered at high risk for hepatitis B if:  You were born in a country where hepatitis B is common. Ask your health care provider which countries are considered high risk.  Your parents were born in a high-risk country, and you have not been immunized against hepatitis B (hepatitis B vaccine).  You have HIV or AIDS.  You use needles to inject street drugs.  You live with someone who has hepatitis B.  You have had sex with someone who has hepatitis B.  You get hemodialysis treatment.  You take certain medicines for conditions, including cancer, organ transplantation, and autoimmune conditions. Hepatitis C  Blood testing is recommended for:  Everyone born from [birth year] through 1964.  Anyone with known risk factors for hepatitis C. Sexually transmitted infections (STIs)  You should be screened for sexually transmitted infections (STIs) including gonorrhea and chlamydia if:  You are sexually active and are younger than 33 years of age.  You are older than 33 years of age and your health care provider tells you that you are at risk for this type of infection.  Your sexual activity has changed since you were last screened and you are at an increased risk for chlamydia or gonorrhea. Ask your health care provider if you are at risk.  If you do not have HIV, but are at risk, it may be recommended that you take a prescription medicine daily to prevent HIV infection. This is called pre-exposure prophylaxis (PrEP). You are considered at risk if:  You are sexually active and do not regularly use condoms or know the HIV status of your partner(s).  You take drugs by injection.  You are sexually  active with a partner who has HIV. Talk with your health care provider about whether you are at high risk of being infected with HIV. If you choose to begin PrEP, you should first be tested for HIV. You should then be tested every 3 months for as long as you are taking PrEP.  PREGNANCY   If you are premenopausal and you may become pregnant, ask your health care provider about preconception counseling.  If you may  become pregnant, take 400 to 800 micrograms (mcg) of folic acid every day.  If you want to prevent pregnancy, talk to your health care provider about birth control (contraception). OSTEOPOROSIS AND MENOPAUSE   Osteoporosis is a disease in which the bones lose minerals and strength with aging. This can result in serious bone fractures. Your risk for osteoporosis can be identified using a bone density scan.  If you are 61 years of age or older, or if you are at risk for osteoporosis and fractures, ask your health care provider if you should be screened.  Ask your health care provider whether you should take a calcium or vitamin D supplement to lower your risk for osteoporosis.  Menopause may have certain physical symptoms and risks.  Hormone replacement therapy may reduce some of these symptoms and risks. Talk to your health care provider about whether hormone replacement therapy is right for you.  HOME CARE INSTRUCTIONS   Schedule regular health, dental, and eye exams.  Stay current with your immunizations.   Do not use any tobacco products including cigarettes, chewing tobacco, or electronic cigarettes.  If you are pregnant, do not drink alcohol.  If you are breastfeeding, limit how much and how often you drink alcohol.  Limit alcohol intake to no more than 1 drink per day for nonpregnant women. One drink equals 12 ounces of beer, 5 ounces of wine, or 1 ounces of hard liquor.  Do not use street drugs.  Do not share needles.  Ask your health care provider for help if  you need support or information about quitting drugs.  Tell your health care provider if you often feel depressed.  Tell your health care provider if you have ever been abused or do not feel safe at home.   This information is not intended to replace advice given to you by your health care provider. Make sure you discuss any questions you have with your health care provider.   Document Released: 11/18/2010 Document Revised: 05/26/2014 Document Reviewed: 04/06/2013 Elsevier Interactive Patient Education [employer].

## 2015-05-15 NOTE — Progress Notes (Signed)
BP 125/83 mmHg  Pulse 93  Temp(Src) 98 F (36.7 C)  Ht 5\' 2"  (1.575 m)  Wt 201 lb (91.173 kg)  BMI 36.75 kg/m2  SpO2 97%  LMP 05/04/2015   Subjective:    Patient ID: Jasmine Tran, female    DOB: 02/03/1981, 34 y.o.   MRN: [id number]  HPI: Jasmine Tran is a 34 y.o. female presenting on 05/15/2015 for comprehensive medical examination. Current medical complaints include: breast pain, elevated liver function  She currently lives with: husband Menopausal Symptoms: no  Depression Screen done today and results listed below:  Depression screen [facility] [place] 2/9 05/15/2015  Decreased Interest 0  Down, Depressed, Hopeless 0  PHQ - 2 Score 0    Past Medical History:  Past Medical History  Diagnosis Date  . Bipolar 1 disorder with moderate mania ([place])   . ADD (attention deficit disorder)     Surgical History:  Past Surgical History  Procedure Laterality Date  . Appendectomy    . Cholecystectomy      Medications:  Current Outpatient Prescriptions on File Prior to Visit  Medication Sig  . EPINEPHrine (EPIPEN 2-PAK) 0.3 mg/0.3 mL IJ SOAJ injection Inject into the muscle.  . L-methylfolate Calcium 7.5 MG TABS Take 2 tablets (15 mg total) by mouth daily.  Marland Kitchen lamoTRIgine (LAMICTAL) 200 MG tablet Take 1 tablet (200 mg total) by mouth daily.  Marland Kitchen lisdexamfetamine (VYVANSE) 60 MG capsule Take 1 capsule (60 mg total) by mouth daily.   No current facility-administered medications on file prior to visit.    Allergies:  Allergies  Allergen Reactions  . Tape Dermatitis  . Doxycycline     Other reaction(s): VOMITING  . Methylprednisolone     Other reaction(s): NAUSEA  . Morphine     Other reaction(s): RASH  . Morphine And Related Hives  . Phenergan [Promethazine Hcl] Nausea And Vomiting  . Promethazine     Other reaction(s): NAUSEA/VOMITING  . Sertraline     Other reaction(s): NAUSEA, PUPILS DILATE    Social History:  Social History   Social History  . Marital Status: Married   Spouse Name: N/A  . Number of Children: N/A  . Years of Education: N/A   Occupational History  . Not on file.   Social History Main Topics  . Smoking status: Current Some Day Smoker -- 0.25 packs/day    Types: Cigarettes  . Smokeless tobacco: Never Used  . Alcohol Use: No  . Drug Use: No  . Sexual Activity: Yes    Birth Control/ Protection: None   Other Topics Concern  . Not on file   Social History Narrative   History  Smoking status  . Current Some Day Smoker -- 0.25 packs/day  . Types: Cigarettes  Smokeless tobacco  . Never Used   History  Alcohol Use No    Family History:  Family History  Problem Relation Age of Onset  . Heart disease Mother   . Heart disease Father   . Cancer Neg Hx   . COPD Neg Hx   . Diabetes Neg Hx   . Hypertension Neg Hx   . Stroke Neg Hx     Past medical history, surgical history, medications, allergies, family history and social history reviewed with patient today and changes made to appropriate areas of the chart.   Review of Systems  Constitutional: Negative.   HENT: Negative.   Eyes: Negative.   Respiratory: Negative.   Cardiovascular: Positive for chest pain. Negative for palpitations, orthopnea, claudication, leg  swelling and PND.  Gastrointestinal: Negative.   Genitourinary: Negative.   Musculoskeletal: Negative.   Skin: Negative.   Neurological: Negative.   Endo/Heme/Allergies: Negative for environmental allergies and polydipsia. Does not bruise/bleed easily.  Psychiatric/Behavioral: Negative.     All other ROS negative except what is listed above and in the HPI.      Objective:    BP 125/83 mmHg  Pulse 93  Temp(Src) 98 F (36.7 C)  Ht 5\' 2"  (1.575 m)  Wt 201 lb (91.173 kg)  BMI 36.75 kg/m2  SpO2 97%  LMP 05/04/2015  Wt Readings from Last 3 Encounters:  05/15/15 201 lb (91.173 kg)  04/24/15 201 lb (91.173 kg)  04/19/15 200 lb (90.719 kg)    Physical Exam  Constitutional: She is oriented to person,  place, and time. She appears well-developed and well-nourished. No distress.  HENT:  Head: Normocephalic and atraumatic.  Right Ear: Hearing and external ear normal.  Left Ear: Hearing and external ear normal.  Nose: Nose normal.  Mouth/Throat: Oropharynx is clear and moist. No oropharyngeal exudate.  Eyes: Conjunctivae, EOM and lids are normal. Pupils are equal, round, and reactive to light. Right eye exhibits no discharge. Left eye exhibits no discharge. No scleral icterus.  Neck: Normal range of motion. Neck supple. No JVD present. No tracheal deviation present. No thyromegaly present.  Cardiovascular: Normal rate, regular rhythm, normal heart sounds and intact distal pulses.  Exam reveals no gallop and no friction rub.   No murmur heard. Pulmonary/Chest: Effort normal and breath sounds normal. No stridor. No respiratory distress. She has no wheezes. She has no rales. She exhibits no tenderness. Right breast exhibits no inverted nipple, no mass, no nipple discharge, no skin change and no tenderness. Left breast exhibits no inverted nipple, no mass, no nipple discharge, no skin change and no tenderness. Breasts are symmetrical.  Abdominal: Soft. Bowel sounds are normal. She exhibits no distension and no mass. There is no tenderness. There is no rebound and no guarding.    Genitourinary: No breast swelling, tenderness, discharge or bleeding.  Deferred with shared decision making  Musculoskeletal: Normal range of motion. She exhibits no edema or tenderness.  Lymphadenopathy:    She has no cervical adenopathy.  Neurological: She is alert and oriented to person, place, and time. She has normal reflexes. She displays normal reflexes. No cranial nerve deficit. She exhibits normal muscle tone. Coordination normal.  Skin: Skin is warm, dry and intact. No rash noted. She is not diaphoretic. No erythema. No pallor.  Psychiatric: She has a normal mood and affect. Her speech is normal and behavior is  normal. Judgment and thought content normal. Cognition and memory are normal.  Nursing note and vitals reviewed.   Results for orders placed or performed in visit on 05/15/15  Microscopic Examination  Result Value Ref Range   WBC, UA None seen 0 -  5 /hpf   RBC, UA 0-2 0 -  2 /hpf   Epithelial Cells (non renal) 0-10 0 - 10 /hpf   Bacteria, UA Few None seen/Few  Lipid Panel Piccolo, Waived  Result Value Ref Range   Cholesterol Piccolo, Waived 190 <200 mg/dL   HDL Chol Piccolo, Waived 53 (L) >59 mg/dL   Triglycerides Piccolo,Waived 131 <150 mg/dL   Chol/HDL Ratio Piccolo,Waive 3.6 mg/dL   LDL Chol Calc Piccolo Waived 111 (H) <100 mg/dL   VLDL Chol Calc Piccolo,Waive 26 <30 mg/dL  UA/M w/rflx Culture, Routine  Result Value Ref Range   Specific  Gravity, UA 1.020 1.005 - 1.030   pH, UA 5.0 5.0 - 7.5   Color, UA Yellow Yellow   Appearance Ur Clear Clear   Leukocytes, UA Negative Negative   Protein, UA Negative Negative/Trace   Glucose, UA Negative Negative   Ketones, UA Negative Negative   RBC, UA 1+ (A) Negative   Bilirubin, UA Negative Negative   Urobilinogen, Ur 0.2 0.2 - 1.0 mg/dL   Nitrite, UA Negative Negative   Microscopic Examination See below:       Assessment & Plan:   Problem List Items Addressed This Visit      Digestive   Adenoma of liver    Continue to follow with GI. If continues to be elevated, will have adenoma removed. CMP checked today. Will let patient know ASAP. Continue to monitor.       Relevant Orders   CBC with Differential/Platelet    Other Visit Diagnoses    Routine general medical examination at a health care facility    -  Primary    Vaccines up to date. Pap up to date. Continue diet and exercsie. Labs checked today. Continue to monitor.     Relevant Orders    CBC with Differential/Platelet    Lipid Panel Piccolo, Waived (Completed)    UA/M w/rflx Culture, Routine (Completed)    Nicotine/cotinine metabolites    Comprehensive metabolic  panel        Follow up plan: Return if symptoms worsen or fail to improve.   LABORATORY TESTING:  - Pap smear: up to date  IMMUNIZATIONS:   - Tdap: Tetanus vaccination status reviewed: last tetanus booster within 10 years.  PATIENT COUNSELING:   Advised to take 1 mg of folate supplement per day if capable of pregnancy.   Sexuality: Discussed sexually transmitted diseases, partner selection, use of condoms, avoidance of unintended pregnancy  and contraceptive alternatives.   Advised to avoid cigarette smoking.  I discussed with the patient that most people either abstain from alcohol or drink within safe limits (<=14/week and <=4 drinks/occasion for males, <=7/weeks and <= 3 drinks/occasion for females) and that the risk for alcohol disorders and other health effects rises proportionally with the number of drinks per week and how often a drinker exceeds daily limits.  Discussed cessation/primary prevention of drug use and availability of treatment for abuse.   Diet: Encouraged to adjust caloric intake to maintain  or achieve ideal body weight, to reduce intake of dietary saturated fat and total fat, to limit sodium intake by avoiding high sodium foods and not adding table salt, and to maintain adequate dietary potassium and calcium preferably from fresh fruits, vegetables, and low-fat dairy products.    stressed the importance of regular exercise  Injury prevention: Discussed safety belts, safety helmets, smoke detector, smoking near bedding or upholstery.   Dental health: Discussed importance of regular tooth brushing, flossing, and dental visits.    NEXT PREVENTATIVE PHYSICAL DUE IN 1 YEAR. Return if symptoms worsen or fail to improve.

## 2015-05-15 NOTE — Assessment & Plan Note (Signed)
Continue to follow with GI. If continues to be elevated, will have adenoma removed. CMP checked today. Will let patient know ASAP. Continue to monitor.

## 2015-05-16 LAB — CBC WITH DIFFERENTIAL/PLATELET
BASOS ABS: 0 10*3/uL (ref 0.0–0.2)
BASOS: 0 %
EOS (ABSOLUTE): 0.1 10*3/uL (ref 0.0–0.4)
Eos: 1 %
Hematocrit: 41.2 % (ref 34.0–46.6)
Hemoglobin: 14.5 g/dL (ref 11.1–15.9)
IMMATURE GRANS (ABS): 0 10*3/uL (ref 0.0–0.1)
IMMATURE GRANULOCYTES: 0 %
LYMPHS: 40 %
Lymphocytes Absolute: 2.6 10*3/uL (ref 0.7–3.1)
MCH: 30.7 pg (ref 26.6–33.0)
MCHC: 35.2 g/dL (ref 31.5–35.7)
MCV: 87 fL (ref 79–97)
MONOCYTES: 6 %
Monocytes Absolute: 0.4 10*3/uL (ref 0.1–0.9)
NEUTROS PCT: 53 %
Neutrophils Absolute: 3.4 10*3/uL (ref 1.4–7.0)
PLATELETS: 282 10*3/uL (ref 150–379)
RBC: 4.73 x10E6/uL (ref 3.77–5.28)
RDW: 14.2 % (ref 12.3–15.4)
WBC: 6.4 10*3/uL (ref 3.4–10.8)

## 2015-05-16 LAB — COMPREHENSIVE METABOLIC PANEL
ALBUMIN: 4.6 g/dL (ref 3.5–5.5)
ALK PHOS: 252 IU/L — AB (ref 39–117)
ALT: 51 IU/L — ABNORMAL HIGH (ref 0–32)
AST: 30 IU/L (ref 0–40)
Albumin/Globulin Ratio: 1.6 (ref 1.1–2.5)
BILIRUBIN TOTAL: 0.3 mg/dL (ref 0.0–1.2)
BUN / CREAT RATIO: 11 (ref 8–20)
BUN: 8 mg/dL (ref 6–20)
CO2: 22 mmol/L (ref 18–29)
CREATININE: 0.74 mg/dL (ref 0.57–1.00)
Calcium: 9.9 mg/dL (ref 8.7–10.2)
Chloride: 98 mmol/L (ref 96–106)
GFR calc non Af Amer: 107 mL/min/{1.73_m2} (ref >59)
GFR, EST AFRICAN AMERICAN: 123 mL/min/{1.73_m2} (ref >59)
GLOBULIN, TOTAL: 2.8 g/dL (ref 1.5–4.5)
Glucose: 83 mg/dL (ref 65–99)
Potassium: 4.2 mmol/L (ref 3.5–5.2)
SODIUM: 139 mmol/L (ref 134–144)
TOTAL PROTEIN: 7.4 g/dL (ref 6.0–8.5)

## 2015-05-21 LAB — NICOTINE/COTININE METABOLITES
Cotinine: 46.9 ng/mL
NICOTINE: NOT DETECTED ng/mL

## 2015-05-22 ENCOUNTER — Encounter: Payer: Self-pay | Admitting: Family Medicine

## 2015-05-23 ENCOUNTER — Other Ambulatory Visit: Payer: Self-pay | Admitting: Family Medicine

## 2015-05-24 MED ORDER — LISDEXAMFETAMINE DIMESYLATE 60 MG PO CAPS: 60.0000 mg | ORAL_CAPSULE | Freq: Every day | ORAL | Status: DC

## 2015-05-24 NOTE — Telephone Encounter (Signed)
Up front. OK for her to pick up. Please have appointment in the next month or so

## 2015-05-24 NOTE — Telephone Encounter (Signed)
Called and left patient a voicemail letting her know that she could pick up her prescription at the front desk.

## 2015-06-04 HISTORY — PX: OPEN HEPATECTOMY [83]: SHX5986

## 2015-06-16 DIAGNOSIS — F419 Anxiety disorder, unspecified: Secondary | ICD-10-CM | POA: Insufficient documentation

## 2015-06-16 DIAGNOSIS — F3181 Bipolar II disorder: Secondary | ICD-10-CM | POA: Insufficient documentation

## 2015-06-26 ENCOUNTER — Encounter: Payer: Self-pay | Admitting: Family Medicine

## 2015-06-26 ENCOUNTER — Ambulatory Visit (INDEPENDENT_AMBULATORY_CARE_PROVIDER_SITE_OTHER): Payer: BLUE CROSS/BLUE SHIELD | Admitting: Family Medicine

## 2015-06-26 VITALS — BP 113/73 | HR 89 | Temp 98.7°F | Ht 62.6 in | Wt 192.0 lb

## 2015-06-26 DIAGNOSIS — R197 Diarrhea, unspecified: Secondary | ICD-10-CM

## 2015-06-26 DIAGNOSIS — D134 Benign neoplasm of liver: Secondary | ICD-10-CM | POA: Diagnosis not present

## 2015-06-26 DIAGNOSIS — J069 Acute upper respiratory infection, unspecified: Secondary | ICD-10-CM

## 2015-06-26 NOTE — Progress Notes (Signed)
BP 113/73 mmHg  Pulse 89  Temp(Src) 98.7 F (37.1 C)  Ht 5' 2.6" (1.59 m)  Wt 192 lb (87.091 kg)  BMI 34.45 kg/m2  SpO2 97%  LMP 04/30/2015 (Approximate)   Subjective:    Patient ID: Jasmine Tran, female    DOB: July 22, 1980, 36 y.o.   MRN: [id number]  HPI: Jasmine Tran is a 35 y.o. female  Chief Complaint  Patient presents with  . Diarrhea  . Nasal Congestion   UPPER RESPIRATORY TRACT INFECTION Duration: 1 week, has been moving with a lot of dust Worst symptom: nasal congestion Fever: no Cough: no Shortness of breath: no Wheezing: no Chest pain: no Chest tightness: no Chest congestion: no Nasal congestion: yes Runny nose: yes Post nasal drip: yes Sneezing: yes Sore throat: no Swollen glands: no Sinus pressure: no Headache: no Face pain: no Toothache: no Ear pain: no  Ear pressure: no  Eyes red/itching:no Eye drainage/crusting: no  Vomiting: no Rash: no Fatigue: yes Sick contacts: no Strep contacts: no  Context: stable Recurrent sinusitis: no Relief with OTC cold/cough medications: no  Treatments attempted: cold/sinus and mucinex   ABDOMINAL ISSUES- just had surgery on a liver adenoma with lobectomy. Recently hospitalized for uncontrolled post-operative pain due to intra-abdominal hematoma. Had severe constipation while in the hospital, now resolving after bottle of mag citrate given to patient in the hospital. To see surgery again in 1 week. Still feeling really sore. Called the surgeon, diarrhea likely has nothing to do with the surgery, so she called here to be seen. Has been on miralax and colase for a few days now Duration: 5 days Nature: cramping and new pain s/p surgery more like a strain Location: RUQ and peri-umbilical  Severity: 0000000  Radiation: yes Episode duration: 20-30 minutes Frequency: intermittent 3x a days Alleviating factors: bowel movement Aggravating factors: 1/2 hour after she eats Treatments attempted: none Constipation:  no Diarrhea: yes Episodes of diarrhea/day: 4-5/day Mucous in the stool: no Heartburn: no Bloating:no Flatulence: no Nausea: no Vomiting: no Melena or hematochezia: no Rash: no Jaundice: no Fever: no Weight loss: no    Relevant past medical, surgical, family and social history reviewed and updated as indicated. Interim medical history since our last visit reviewed. Allergies and medications reviewed and updated.  Review of Systems  Constitutional: Negative.   HENT: Negative.   Respiratory: Negative.   Cardiovascular: Negative.   Gastrointestinal: Positive for abdominal pain and diarrhea. Negative for nausea, vomiting, constipation, blood in stool, abdominal distention, anal bleeding and rectal pain.  Genitourinary: Negative.   Psychiatric/Behavioral: Negative.     Per HPI unless specifically indicated above     Objective:    BP 113/73 mmHg  Pulse 89  Temp(Src) 98.7 F (37.1 C)  Ht 5' 2.6" (1.59 m)  Wt 192 lb (87.091 kg)  BMI 34.45 kg/m2  SpO2 97%  LMP 04/30/2015 (Approximate)  Wt Readings from Last 3 Encounters:  06/26/15 192 lb (87.091 kg)  05/15/15 201 lb (91.173 kg)  04/24/15 201 lb (91.173 kg)    Physical Exam  Constitutional: She is oriented to person, place, and time. She appears well-developed and well-nourished. No distress.  HENT:  Head: Normocephalic and atraumatic.  Right Ear: Hearing and external ear normal.  Left Ear: Hearing and external ear normal.  Nose: Nose normal.  Mouth/Throat: Oropharynx is clear and moist. No oropharyngeal exudate.  Eyes: Conjunctivae, EOM and lids are normal. Pupils are equal, round, and reactive to light. Right eye exhibits no discharge. Left eye exhibits  no discharge. No scleral icterus.  Neck: Normal range of motion. Neck supple. No JVD present. No tracheal deviation present. No thyromegaly present.  Cardiovascular: Normal rate, regular rhythm, normal heart sounds and intact distal pulses.  Exam reveals no gallop and  no friction rub.   No murmur heard. Pulmonary/Chest: Effort normal and breath sounds normal. No stridor. No respiratory distress. She has no wheezes. She has no rales. She exhibits no tenderness.  Abdominal: Soft. Bowel sounds are normal. She exhibits no distension and no mass. There is tenderness. There is no rebound and no guarding.  Musculoskeletal: Normal range of motion.  Lymphadenopathy:    She has cervical adenopathy.  Neurological: She is alert and oriented to person, place, and time.  Skin: Skin is warm, dry and intact. No rash noted. She is not diaphoretic. No erythema. No pallor.  Psychiatric: She has a normal mood and affect. Her speech is normal and behavior is normal. Judgment and thought content normal. Cognition and memory are normal.  Nursing note and vitals reviewed.   Results for orders placed or performed in visit on 05/15/15  Microscopic Examination  Result Value Ref Range   WBC, UA None seen 0 -  5 /hpf   RBC, UA 0-2 0 -  2 /hpf   Epithelial Cells (non renal) 0-10 0 - 10 /hpf   Bacteria, UA Few None seen/Few  CBC with Differential/Platelet  Result Value Ref Range   WBC 6.4 3.4 - 10.8 x10E3/uL   RBC 4.73 3.77 - 5.28 x10E6/uL   Hemoglobin 14.5 11.1 - 15.9 g/dL   Hematocrit 41.2 34.0 - 46.6 %   MCV 87 79 - 97 fL   MCH 30.7 26.6 - 33.0 pg   MCHC 35.2 31.5 - 35.7 g/dL   RDW 14.2 12.3 - 15.4 %   Platelets 282 150 - 379 x10E3/uL   Neutrophils 53 %   Lymphs 40 %   Monocytes 6 %   Eos 1 %   Basos 0 %   Neutrophils Absolute 3.4 1.4 - 7.0 x10E3/uL   Lymphocytes Absolute 2.6 0.7 - 3.1 x10E3/uL   Monocytes Absolute 0.4 0.1 - 0.9 x10E3/uL   EOS (ABSOLUTE) 0.1 0.0 - 0.4 x10E3/uL   Basophils Absolute 0.0 0.0 - 0.2 x10E3/uL   Immature Granulocytes 0 %   Immature Grans (Abs) 0.0 0.0 - 0.1 x10E3/uL  Lipid Panel Piccolo, Waived  Result Value Ref Range   Cholesterol Piccolo, Waived 190 <200 mg/dL   HDL Chol Piccolo, Waived 53 (L) >59 mg/dL   Triglycerides  Piccolo,Waived 131 <150 mg/dL   Chol/HDL Ratio Piccolo,Waive 3.6 mg/dL   LDL Chol Calc Piccolo Waived 111 (H) <100 mg/dL   VLDL Chol Calc Piccolo,Waive 26 <30 mg/dL  UA/M w/rflx Culture, Routine  Result Value Ref Range   Specific Gravity, UA 1.020 1.005 - 1.030   pH, UA 5.0 5.0 - 7.5   Color, UA Yellow Yellow   Appearance Ur Clear Clear   Leukocytes, UA Negative Negative   Protein, UA Negative Negative/Trace   Glucose, UA Negative Negative   Ketones, UA Negative Negative   RBC, UA 1+ (A) Negative   Bilirubin, UA Negative Negative   Urobilinogen, Ur 0.2 0.2 - 1.0 mg/dL   Nitrite, UA Negative Negative   Microscopic Examination See below:   Nicotine/cotinine metabolites  Result Value Ref Range   Nicotine None Detected ng/mL   Cotinine 46.9 ng/mL  Comprehensive metabolic panel  Result Value Ref Range   Glucose 83 65 - 99  mg/dL   BUN 8 6 - 20 mg/dL   Creatinine, Ser 0.74 0.57 - 1.00 mg/dL   GFR calc non Af Amer 107 >59 mL/min/1.73   GFR calc Af Amer 123 >59 mL/min/1.73   BUN/Creatinine Ratio 11 8 - 20   Sodium 139 134 - 144 mmol/L   Potassium 4.2 3.5 - 5.2 mmol/L   Chloride 98 96 - 106 mmol/L   CO2 22 18 - 29 mmol/L   Calcium 9.9 8.7 - 10.2 mg/dL   Total Protein 7.4 6.0 - 8.5 g/dL   Albumin 4.6 3.5 - 5.5 g/dL   Globulin, Total 2.8 1.5 - 4.5 g/dL   Albumin/Globulin Ratio 1.6 1.1 - 2.5   Bilirubin Total 0.3 0.0 - 1.2 mg/dL   Alkaline Phosphatase 252 (H) 39 - 117 IU/L   AST 30 0 - 40 IU/L   ALT 51 (H) 0 - 32 IU/L      Assessment & Plan:   Problem List Items Addressed This Visit      Digestive   Adenoma of liver    Continue to follow with surgery. Continue to monitor.        Other Visit Diagnoses    Diarrhea, unspecified type    -  Primary    Possibly due to recent surgery. Will check stool studies. Will start metamucil. Also possible due to recent laxatives or GI bug. Await results. BRAT diet.    Upper respiratory infection        Will start on sudafed as she can't  take prednisone. Continue to monitor closely, if not getting better in a couple of days, she will let us know.         Follow up plan: No Follow-up on file.

## 2015-06-26 NOTE — Assessment & Plan Note (Signed)
Continue to follow with surgery. Continue to monitor.

## 2015-07-16 ENCOUNTER — Telehealth: Payer: Self-pay | Admitting: Family Medicine

## 2015-07-16 NOTE — Telephone Encounter (Signed)
We can get her a refill, but with all the stuff that was going on with her, we didn't get our regular appointment. Can we get her an appointment scheduled for her ADD? Thanks!

## 2015-07-16 NOTE — Telephone Encounter (Signed)
Called and left a message for patient to call and schedule an appointment.

## 2015-07-17 NOTE — Telephone Encounter (Signed)
Called and left a message for patient to give [place] a call and schedule an appointment for ADD.

## 2015-07-18 NOTE — Telephone Encounter (Signed)
Patient scheduled for 07/22/16 @ 1:00pm.

## 2015-07-23 ENCOUNTER — Encounter: Payer: Self-pay | Admitting: Family Medicine

## 2015-07-23 ENCOUNTER — Ambulatory Visit (INDEPENDENT_AMBULATORY_CARE_PROVIDER_SITE_OTHER): Payer: BLUE CROSS/BLUE SHIELD | Admitting: Family Medicine

## 2015-07-23 VITALS — BP 123/81 | HR 88 | Temp 98.7°F | Ht 62.0 in | Wt 194.0 lb

## 2015-07-23 DIAGNOSIS — J0100 Acute maxillary sinusitis, unspecified: Secondary | ICD-10-CM | POA: Diagnosis not present

## 2015-07-23 DIAGNOSIS — D134 Benign neoplasm of liver: Secondary | ICD-10-CM | POA: Diagnosis not present

## 2015-07-23 DIAGNOSIS — F17201 Nicotine dependence, unspecified, in remission: Secondary | ICD-10-CM | POA: Diagnosis not present

## 2015-07-23 DIAGNOSIS — F988 Other specified behavioral and emotional disorders with onset usually occurring in childhood and adolescence: Secondary | ICD-10-CM

## 2015-07-23 DIAGNOSIS — F909 Attention-deficit hyperactivity disorder, unspecified type: Secondary | ICD-10-CM | POA: Diagnosis not present

## 2015-07-23 MED ORDER — LISDEXAMFETAMINE DIMESYLATE 60 MG PO CAPS: 60.0000 mg | ORAL_CAPSULE | Freq: Every day | ORAL | Status: DC

## 2015-07-23 MED ORDER — AMOXICILLIN-POT CLAVULANATE 875-125 MG PO TABS: 1.0000 | ORAL_TABLET | Freq: Two times a day (BID) | ORAL | Status: DC

## 2015-07-23 NOTE — Assessment & Plan Note (Signed)
Due for rechecks on her labs. Will come back in the AM. Labs ordered today. Await results.

## 2015-07-23 NOTE — Assessment & Plan Note (Signed)
Under good control. Doing better on the 60mg . Will refill Rxs for 3 months. Check back in in 3 months. Continue to monitor.

## 2015-07-23 NOTE — Progress Notes (Signed)
BP 123/81 mmHg  Pulse 88  Temp(Src) 98.7 F (37.1 C)  Ht 5\' 2"  (1.575 m)  Wt 194 lb (87.998 kg)  BMI 35.47 kg/m2  SpO2 100%  LMP 07/21/2015 (Exact Date)   Subjective:    Patient ID: Jasmine Tran, female    DOB: [birth year], 35 y.o.   MRN: [id number]  HPI: Roopa Rootes is a 35 y.o. female  Chief Complaint  Patient presents with  . ADHD   ADHD FOLLOW UP ADHD status: controlled Satisfied with current therapy: yes Medication compliance:  excellent compliance Controlled substance contract: yes Previous psychiatry evaluation: yes Taking meds on weekends/vacations: yes Work/school performance:  good Difficulty sustaining attention/completing tasks: no Distracted by extraneous stimuli: no Does not listen when spoken to: no  Fidgets with hands or feet: no Unable to stay in seat: no Blurts out/interrupts others: no ADHD Medication Side Effects: no  UPPER RESPIRATORY TRACT INFECTION Duration: 1 month Worst symptom: nasal congestion and drainage, back pain in the area of her lung Fever: no Cough: yes Shortness of breath: no Wheezing: no Chest pain: no Chest tightness: no Chest congestion: no Nasal congestion: yes Runny nose: yes Post nasal drip: yes Sneezing: yes Sore throat: no Swollen glands: no Sinus pressure: yes Headache: yes Face pain: yes Toothache: no Ear pain: no  Ear pressure: no  Eyes red/itching:no Eye drainage/crusting: no  Vomiting: no Rash: no Fatigue: no Sick contacts: yes Strep contacts: yes  Context: stable Recurrent sinusitis: no Relief with OTC cold/cough medications: no  Treatments attempted: flonase, claritin, cold and sinus    Relevant past medical, surgical, family and social history reviewed and updated as indicated. Interim medical history since our last visit reviewed. Allergies and medications reviewed and updated.  Review of Systems  Constitutional: Negative.   HENT: Positive for congestion, postnasal drip, rhinorrhea, sinus  pressure and sneezing. Negative for dental problem, drooling, ear discharge, ear pain, facial swelling, hearing loss, mouth sores, nosebleeds, sore throat, tinnitus, trouble swallowing and voice change.   Respiratory: Negative.   Cardiovascular: Negative.   Psychiatric/Behavioral: Negative.    Per HPI unless specifically indicated above     Objective:    BP 123/81 mmHg  Pulse 88  Temp(Src) 98.7 F (37.1 C)  Ht 5\' 2"  (1.575 m)  Wt 194 lb (87.998 kg)  BMI 35.47 kg/m2  SpO2 100%  LMP 07/21/2015 (Exact Date)  Wt Readings from Last 3 Encounters:  07/23/15 194 lb (87.998 kg)  06/26/15 192 lb (87.091 kg)  05/15/15 201 lb (91.173 kg)    Physical Exam  Constitutional: She is oriented to person, place, and time. She appears well-developed and well-nourished. No distress.  HENT:  Head: Normocephalic and atraumatic.  Right Ear: Hearing, tympanic membrane, external ear and ear canal normal.  Left Ear: Hearing, tympanic membrane, external ear and ear canal normal.  Nose: Mucosal edema, rhinorrhea and sinus tenderness present. No nose lacerations, nasal deformity, septal deviation or nasal septal hematoma. No epistaxis.  No foreign bodies. Right sinus exhibits maxillary sinus tenderness. Right sinus exhibits no frontal sinus tenderness. Left sinus exhibits maxillary sinus tenderness. Left sinus exhibits no frontal sinus tenderness.  Mouth/Throat: Uvula is midline, oropharynx is clear and moist and mucous membranes are normal. No oropharyngeal exudate.  Eyes: Conjunctivae, EOM and lids are normal. Pupils are equal, round, and reactive to light. Right eye exhibits no discharge. Left eye exhibits no discharge. No scleral icterus.  Neck: Normal range of motion. Neck supple. No JVD present. No tracheal deviation present. No thyromegaly  present.  Cardiovascular: Normal rate, regular rhythm, normal heart sounds and intact distal pulses.  Exam reveals no gallop and no friction rub.   No murmur  heard. Pulmonary/Chest: Effort normal and breath sounds normal. No stridor. No respiratory distress. She has no wheezes. She has no rales. She exhibits no tenderness.  Musculoskeletal: Normal range of motion.  Lymphadenopathy:    She has cervical adenopathy.  Neurological: She is alert and oriented to person, place, and time.  Skin: Skin is warm, dry and intact. No rash noted. She is not diaphoretic. No erythema. No pallor.  Psychiatric: She has a normal mood and affect. Her speech is normal and behavior is normal. Judgment and thought content normal. Cognition and memory are normal.  Nursing note and vitals reviewed.   Results for orders placed or performed in visit on 05/15/15  Microscopic Examination  Result Value Ref Range   WBC, UA None seen 0 -  5 /hpf   RBC, UA 0-2 0 -  2 /hpf   Epithelial Cells (non renal) 0-10 0 - 10 /hpf   Bacteria, UA Few None seen/Few  CBC with Differential/Platelet  Result Value Ref Range   WBC 6.4 3.4 - 10.8 x10E3/uL   RBC 4.73 3.77 - 5.28 x10E6/uL   Hemoglobin 14.5 11.1 - 15.9 g/dL   Hematocrit 41.2 34.0 - 46.6 %   MCV 87 79 - 97 fL   MCH 30.7 26.6 - 33.0 pg   MCHC 35.2 31.5 - 35.7 g/dL   RDW 14.2 12.3 - 15.4 %   Platelets 282 150 - 379 x10E3/uL   Neutrophils 53 %   Lymphs 40 %   Monocytes 6 %   Eos 1 %   Basos 0 %   Neutrophils Absolute 3.4 1.4 - 7.0 x10E3/uL   Lymphocytes Absolute 2.6 0.7 - 3.1 x10E3/uL   Monocytes Absolute 0.4 0.1 - 0.9 x10E3/uL   EOS (ABSOLUTE) 0.1 0.0 - 0.4 x10E3/uL   Basophils Absolute 0.0 0.0 - 0.2 x10E3/uL   Immature Granulocytes 0 %   Immature Grans (Abs) 0.0 0.0 - 0.1 x10E3/uL  Lipid Panel Piccolo, Waived  Result Value Ref Range   Cholesterol Piccolo, Waived 190 <200 mg/dL   HDL Chol Piccolo, Waived 53 (L) >59 mg/dL   Triglycerides Piccolo,Waived 131 <150 mg/dL   Chol/HDL Ratio Piccolo,Waive 3.6 mg/dL   LDL Chol Calc Piccolo Waived 111 (H) <100 mg/dL   VLDL Chol Calc Piccolo,Waive 26 <30 mg/dL  UA/M w/rflx  Culture, Routine  Result Value Ref Range   Specific Gravity, UA 1.020 1.005 - 1.030   pH, UA 5.0 5.0 - 7.5   Color, UA Yellow Yellow   Appearance Ur Clear Clear   Leukocytes, UA Negative Negative   Protein, UA Negative Negative/Trace   Glucose, UA Negative Negative   Ketones, UA Negative Negative   RBC, UA 1+ (A) Negative   Bilirubin, UA Negative Negative   Urobilinogen, Ur 0.2 0.2 - 1.0 mg/dL   Nitrite, UA Negative Negative   Microscopic Examination See below:   Nicotine/cotinine metabolites  Result Value Ref Range   Nicotine None Detected ng/mL   Cotinine 46.9 ng/mL  Comprehensive metabolic panel  Result Value Ref Range   Glucose 83 65 - 99 mg/dL   BUN 8 6 - 20 mg/dL   Creatinine, Ser 0.74 0.57 - 1.00 mg/dL   GFR calc non Af Amer 107 >59 mL/min/1.73   GFR calc Af Amer 123 >59 mL/min/1.73   BUN/Creatinine Ratio 11 8 - 20  Sodium 139 134 - 144 mmol/L   Potassium 4.2 3.5 - 5.2 mmol/L   Chloride 98 96 - 106 mmol/L   CO2 22 18 - 29 mmol/L   Calcium 9.9 8.7 - 10.2 mg/dL   Total Protein 7.4 6.0 - 8.5 g/dL   Albumin 4.6 3.5 - 5.5 g/dL   Globulin, Total 2.8 1.5 - 4.5 g/dL   Albumin/Globulin Ratio 1.6 1.1 - 2.5   Bilirubin Total 0.3 0.0 - 1.2 mg/dL   Alkaline Phosphatase 252 (H) 39 - 117 IU/L   AST 30 0 - 40 IU/L   ALT 51 (H) 0 - 32 IU/L      Assessment & Plan:   Problem List Items Addressed This Visit      Digestive   Adenoma of liver    Due for rechecks on her labs. Will come back in the AM. Labs ordered today. Await results.       Relevant Orders   Comprehensive metabolic panel     Other   ADD (attention deficit disorder) - Primary    Under good control. Doing better on the 60mg . Will refill Rxs for 3 months. Check back in in 3 months. Continue to monitor.        Other Visit Diagnoses    Acute maxillary sinusitis, recurrence not specified        Will treat with augmentin. Call if not getting better or getting worse. Call with any concerns. Rest and fluids.      Relevant Medications    amoxicillin-clavulanate (AUGMENTIN) 875-125 MG tablet    Tobacco abuse, in remission        Needs cotine testing for work. Ordered today.     Relevant Orders    Nicotine/cotinine metabolites        Follow up plan: Return in about 3 months (around 10/23/2015) for Labs ASAP .

## 2015-08-10 ENCOUNTER — Encounter: Payer: Self-pay | Admitting: Family Medicine

## 2015-09-11 ENCOUNTER — Ambulatory Visit (INDEPENDENT_AMBULATORY_CARE_PROVIDER_SITE_OTHER): Payer: BLUE CROSS/BLUE SHIELD | Admitting: Family Medicine

## 2015-09-11 ENCOUNTER — Encounter: Payer: Self-pay | Admitting: Family Medicine

## 2015-09-11 VITALS — BP 118/80 | HR 92 | Temp 98.1°F | Ht 62.1 in | Wt 199.0 lb

## 2015-09-11 DIAGNOSIS — J0111 Acute recurrent frontal sinusitis: Secondary | ICD-10-CM

## 2015-09-11 DIAGNOSIS — D134 Benign neoplasm of liver: Secondary | ICD-10-CM | POA: Diagnosis not present

## 2015-09-11 DIAGNOSIS — F17201 Nicotine dependence, unspecified, in remission: Secondary | ICD-10-CM | POA: Diagnosis not present

## 2015-09-11 MED ORDER — CEFDINIR 300 MG PO CAPS: 300.0000 mg | ORAL_CAPSULE | Freq: Two times a day (BID) | ORAL | Status: DC

## 2015-09-11 NOTE — Assessment & Plan Note (Signed)
Rechecking labs today. Await results.  

## 2015-09-11 NOTE — Progress Notes (Signed)
BP 118/80 mmHg  Pulse 92  Temp(Src) 98.1 F (36.7 C)  Ht 5' 2.1" (1.577 m)  Wt 199 lb (90.266 kg)  BMI 36.30 kg/m2  SpO2 98%  LMP 07/04/2015 (Approximate)   Subjective:    Patient ID: Jasmine Tran, female    DOB: 09/22/1980, 35 y.o.   MRN: [id number]  HPI: Jasmine Tran is a 35 y.o. female  Chief Complaint  Patient presents with  . URI    Patient states that she has been sick since February, she just can not get better   UPPER RESPIRATORY TRACT INFECTION Duration: 2 month Worst symptom: nasal congetions Fever: no Cough: no Shortness of breath: no Wheezing: no Chest pain: no Chest tightness: no Chest congestion: no Nasal congestion: yes Runny nose: yes Post nasal drip: yes Sneezing: no Sore throat: no Swollen glands: no Sinus pressure: yes Headache: no Face pain: yes Toothache: no Ear pain: no  Ear pressure: no  Eyes red/itching:no Eye drainage/crusting: no  Vomiting: no Rash: no Fatigue: yes Sick contacts: no Strep contacts: no  Context: stable Recurrent sinusitis: yes Relief with OTC cold/cough medications: no  Treatments attempted: cold/sinus, mucinex, anti-histamine and antibiotics   Relevant past medical, surgical, family and social history reviewed and updated as indicated. Interim medical history since our last visit reviewed. Allergies and medications reviewed and updated.  Review of Systems  Constitutional: Negative.   HENT: Positive for congestion, postnasal drip, rhinorrhea, sinus pressure, sneezing and sore throat. Negative for dental problem, drooling, ear discharge, facial swelling, hearing loss, mouth sores, nosebleeds, tinnitus, trouble swallowing and voice change.   Respiratory: Negative.   Cardiovascular: Negative.   Psychiatric/Behavioral: Negative.     Per HPI unless specifically indicated above     Objective:    BP 118/80 mmHg  Pulse 92  Temp(Src) 98.1 F (36.7 C)  Ht 5' 2.1" (1.577 m)  Wt 199 lb (90.266 kg)  BMI 36.30  kg/m2  SpO2 98%  LMP 07/04/2015 (Approximate)  Wt Readings from Last 3 Encounters:  09/11/15 199 lb (90.266 kg)  07/23/15 194 lb (87.998 kg)  06/26/15 192 lb (87.091 kg)    Physical Exam  Constitutional: She is oriented to person, place, and time. She appears well-developed and well-nourished. No distress.  HENT:  Head: Normocephalic and atraumatic.  Right Ear: Hearing, tympanic membrane, external ear and ear canal normal.  Left Ear: Hearing, tympanic membrane, external ear and ear canal normal.  Nose: Mucosal edema and rhinorrhea present. Right sinus exhibits frontal sinus tenderness. Right sinus exhibits no maxillary sinus tenderness. Left sinus exhibits frontal sinus tenderness. Left sinus exhibits no maxillary sinus tenderness.  Mouth/Throat: Oropharynx is clear and moist. No oropharyngeal exudate.  Eyes: Conjunctivae, EOM and lids are normal. Pupils are equal, round, and reactive to light. Right eye exhibits no discharge. Left eye exhibits no discharge. No scleral icterus.  Neck: Normal range of motion. Neck supple. No JVD present. No tracheal deviation present. No thyromegaly present.  Cardiovascular: Normal rate, regular rhythm, normal heart sounds and intact distal pulses.  Exam reveals no gallop and no friction rub.   No murmur heard. Pulmonary/Chest: Effort normal and breath sounds normal. No stridor. No respiratory distress. She has no wheezes. She has no rales. She exhibits no tenderness.  Musculoskeletal: Normal range of motion.  Lymphadenopathy:    She has cervical adenopathy.  Neurological: She is alert and oriented to person, place, and time.  Skin: Skin is warm, dry and intact. No rash noted. She is not diaphoretic. No erythema.  No pallor.  Psychiatric: She has a normal mood and affect. Her speech is normal and behavior is normal. Judgment and thought content normal. Cognition and memory are normal.  Nursing note and vitals reviewed.   Results for orders placed or  performed in visit on 05/15/15  Microscopic Examination  Result Value Ref Range   WBC, UA None seen 0 -  5 /hpf   RBC, UA 0-2 0 -  2 /hpf   Epithelial Cells (non renal) 0-10 0 - 10 /hpf   Bacteria, UA Few None seen/Few  CBC with Differential/Platelet  Result Value Ref Range   WBC 6.4 3.4 - 10.8 x10E3/uL   RBC 4.73 3.77 - 5.28 x10E6/uL   Hemoglobin 14.5 11.1 - 15.9 g/dL   Hematocrit 41.2 34.0 - 46.6 %   MCV 87 79 - 97 fL   MCH 30.7 26.6 - 33.0 pg   MCHC 35.2 31.5 - 35.7 g/dL   RDW 14.2 12.3 - 15.4 %   Platelets 282 150 - 379 x10E3/uL   Neutrophils 53 %   Lymphs 40 %   Monocytes 6 %   Eos 1 %   Basos 0 %   Neutrophils Absolute 3.4 1.4 - 7.0 x10E3/uL   Lymphocytes Absolute 2.6 0.7 - 3.1 x10E3/uL   Monocytes Absolute 0.4 0.1 - 0.9 x10E3/uL   EOS (ABSOLUTE) 0.1 0.0 - 0.4 x10E3/uL   Basophils Absolute 0.0 0.0 - 0.2 x10E3/uL   Immature Granulocytes 0 %   Immature Grans (Abs) 0.0 0.0 - 0.1 x10E3/uL  Lipid Panel Piccolo, Waived  Result Value Ref Range   Cholesterol Piccolo, Waived 190 <200 mg/dL   HDL Chol Piccolo, Waived 53 (L) >59 mg/dL   Triglycerides Piccolo,Waived 131 <150 mg/dL   Chol/HDL Ratio Piccolo,Waive 3.6 mg/dL   LDL Chol Calc Piccolo Waived 111 (H) <100 mg/dL   VLDL Chol Calc Piccolo,Waive 26 <30 mg/dL  UA/M w/rflx Culture, Routine  Result Value Ref Range   Specific Gravity, UA 1.020 1.005 - 1.030   pH, UA 5.0 5.0 - 7.5   Color, UA Yellow Yellow   Appearance Ur Clear Clear   Leukocytes, UA Negative Negative   Protein, UA Negative Negative/Trace   Glucose, UA Negative Negative   Ketones, UA Negative Negative   RBC, UA 1+ (A) Negative   Bilirubin, UA Negative Negative   Urobilinogen, Ur 0.2 0.2 - 1.0 mg/dL   Nitrite, UA Negative Negative   Microscopic Examination See below:   Nicotine/cotinine metabolites  Result Value Ref Range   Nicotine None Detected ng/mL   Cotinine 46.9 ng/mL  Comprehensive metabolic panel  Result Value Ref Range   Glucose 83 65 - 99  mg/dL   BUN 8 6 - 20 mg/dL   Creatinine, Ser 0.74 0.57 - 1.00 mg/dL   GFR calc non Af Amer 107 >59 mL/min/1.73   GFR calc Af Amer 123 >59 mL/min/1.73   BUN/Creatinine Ratio 11 8 - 20   Sodium 139 134 - 144 mmol/L   Potassium 4.2 3.5 - 5.2 mmol/L   Chloride 98 96 - 106 mmol/L   CO2 22 18 - 29 mmol/L   Calcium 9.9 8.7 - 10.2 mg/dL   Total Protein 7.4 6.0 - 8.5 g/dL   Albumin 4.6 3.5 - 5.5 g/dL   Globulin, Total 2.8 1.5 - 4.5 g/dL   Albumin/Globulin Ratio 1.6 1.1 - 2.5   Bilirubin Total 0.3 0.0 - 1.2 mg/dL   Alkaline Phosphatase 252 (H) 39 - 117 IU/L   AST 30  0 - 40 IU/L   ALT 51 (H) 0 - 32 IU/L      Assessment & Plan:   Problem List Items Addressed This Visit      Digestive   Adenoma of liver    Rechecking labs today. Await results.        Other Visit Diagnoses    Acute recurrent frontal sinusitis    -  Primary    Will treat with omnicef. If not better in 1 week, will refer to ENT.     Relevant Medications    cefdinir (OMNICEF) 300 MG capsule    Tobacco abuse, in remission        Needs cotine testing for work. Ordered today.         Follow up plan: Return if symptoms worsen or fail to improve.

## 2015-09-17 LAB — COMPREHENSIVE METABOLIC PANEL
A/G RATIO: 1.3 (ref 1.2–2.2)
ALT: 29 IU/L (ref 0–32)
AST: 29 IU/L (ref 0–40)
Albumin: 4.3 g/dL (ref 3.5–5.5)
Alkaline Phosphatase: 104 IU/L (ref 39–117)
BUN/Creatinine Ratio: 13 (ref 9–23)
BUN: 8 mg/dL (ref 6–20)
Bilirubin Total: 0.2 mg/dL (ref 0.0–1.2)
CALCIUM: 9.3 mg/dL (ref 8.7–10.2)
CO2: 24 mmol/L (ref 18–29)
Chloride: 96 mmol/L (ref 96–106)
Creatinine, Ser: 0.64 mg/dL (ref 0.57–1.00)
GFR calc Af Amer: 136 mL/min/{1.73_m2} (ref >59)
GFR, EST NON AFRICAN AMERICAN: 118 mL/min/{1.73_m2} (ref >59)
GLUCOSE: 81 mg/dL (ref 65–99)
Globulin, Total: 3.2 g/dL (ref 1.5–4.5)
POTASSIUM: 4 mmol/L (ref 3.5–5.2)
Sodium: 136 mmol/L (ref 134–144)
Total Protein: 7.5 g/dL (ref 6.0–8.5)

## 2015-09-17 LAB — NICOTINE/COTININE METABOLITES
COTININE: NOT DETECTED ng/mL
Nicotine: NOT DETECTED ng/mL

## 2015-09-18 ENCOUNTER — Telehealth: Payer: Self-pay | Admitting: Family Medicine

## 2015-09-18 NOTE — Telephone Encounter (Signed)
She is not sure if she is better. She is going to give it a little bit more time and will let us know if she would like to see the ENT.

## 2015-09-18 NOTE — Telephone Encounter (Signed)
-----   Message from Valerie Roys, DO sent at 09/11/2015  9:29 PM EDT ----- Call about sinuses

## 2015-10-07 ENCOUNTER — Other Ambulatory Visit: Payer: Self-pay | Admitting: Family Medicine

## 2015-10-16 DIAGNOSIS — R1011 Right upper quadrant pain: Secondary | ICD-10-CM | POA: Diagnosis not present

## 2015-10-16 DIAGNOSIS — D134 Benign neoplasm of liver: Secondary | ICD-10-CM | POA: Diagnosis not present

## 2015-10-16 DIAGNOSIS — L730 Acne keloid: Secondary | ICD-10-CM | POA: Diagnosis not present

## 2015-10-23 ENCOUNTER — Encounter: Payer: Self-pay | Admitting: Family Medicine

## 2015-10-23 ENCOUNTER — Ambulatory Visit (INDEPENDENT_AMBULATORY_CARE_PROVIDER_SITE_OTHER): Payer: BLUE CROSS/BLUE SHIELD | Admitting: Family Medicine

## 2015-10-23 VITALS — BP 109/79 | HR 86 | Temp 98.6°F | Wt 197.0 lb

## 2015-10-23 DIAGNOSIS — L259 Unspecified contact dermatitis, unspecified cause: Secondary | ICD-10-CM

## 2015-10-23 DIAGNOSIS — F909 Attention-deficit hyperactivity disorder, unspecified type: Secondary | ICD-10-CM | POA: Diagnosis not present

## 2015-10-23 DIAGNOSIS — R1011 Right upper quadrant pain: Secondary | ICD-10-CM | POA: Diagnosis not present

## 2015-10-23 DIAGNOSIS — F988 Other specified behavioral and emotional disorders with onset usually occurring in childhood and adolescence: Secondary | ICD-10-CM

## 2015-10-23 DIAGNOSIS — J320 Chronic maxillary sinusitis: Secondary | ICD-10-CM

## 2015-10-23 MED ORDER — LISDEXAMFETAMINE DIMESYLATE 60 MG PO CAPS: 60.0000 mg | ORAL_CAPSULE | Freq: Every day | ORAL | Status: DC

## 2015-10-23 MED ORDER — TRIAMCINOLONE ACETONIDE 0.5 % EX OINT: 1.0000 "application " | TOPICAL_OINTMENT | Freq: Two times a day (BID) | CUTANEOUS | Status: DC

## 2015-10-23 MED ORDER — EPINEPHRINE 0.3 MG/0.3ML IJ SOAJ: 0.3000 mg | Freq: Once | INTRAMUSCULAR | Status: DC

## 2015-10-23 MED ORDER — PREDNISONE 10 MG PO TABS: ORAL_TABLET | ORAL | Status: DC

## 2015-10-23 MED ORDER — LISDEXAMFETAMINE DIMESYLATE 60 MG PO CAPS
60.0000 mg | ORAL_CAPSULE | Freq: Every day | ORAL | Status: DC
Start: 2015-10-23 — End: 2015-10-23

## 2015-10-23 NOTE — Assessment & Plan Note (Signed)
Under good control. Refill of meds for 3 months given today. Call with concerns.

## 2015-10-23 NOTE — Progress Notes (Signed)
BP 109/79 mmHg  Pulse 86  Temp(Src) 98.6 F (37 C)  Wt 197 lb (89.359 kg)  SpO2 98%  LMP 06/20/2015 (Approximate)   Subjective:    Patient ID: Jasmine Tran, female    DOB: 29-Aug-1980, 35 y.o.   MRN: [id number]  HPI: Jasmine Tran is a 35 y.o. female  Chief Complaint  Patient presents with  . ADHD  . Rash    Patient thinks that she has poison oak on her leg  . Medication Refill    Patient needs a refill on her Epi-pen, the one that she has is expired.   ADHD FOLLOW UP ADHD status: controlled Satisfied with current therapy: yes Medication compliance:  excellent compliance Controlled substance contract: yes Previous psychiatry evaluation: yes Taking meds on weekends/vacations: occasionally Work/school performance:  excellent Difficulty sustaining attention/completing tasks: no Distracted by extraneous stimuli: no Does not listen when spoken to: no  Fidgets with hands or feet: no Unable to stay in seat: no Blurts out/interrupts others: no ADHD Medication Side Effects: no  RASH Duration:  2 days  Location: legs  Itching: yes Burning: yes Redness: yes Oozing: yes Scaling: no Blisters: yes Painful: yes Fevers: no Change in detergents/soaps/personal care products: no Recent illness: no Recent travel:no History of same: yes Context: worse Alleviating factors: nothing Treatments attempted:benadryl and lotion/moisturizer Shortness of breath: no  Throat/tongue swelling: no Myalgias/arthralgias: no  Sinuses never got better. Still feeling really stuffy and no better. Would like to see ENT. Has been having pain in her RUQ for the past couple of weeks- saw surgeon, but not feeling any better  Relevant past medical, surgical, family and social history reviewed and updated as indicated. Interim medical history since our last visit reviewed. Allergies and medications reviewed and updated.  Review of Systems  Constitutional: Negative.   Respiratory: Negative.    Cardiovascular: Negative.   Skin: Positive for rash. Negative for color change, pallor and wound.  Psychiatric/Behavioral: Negative.     Per HPI unless specifically indicated above     Objective:    BP 109/79 mmHg  Pulse 86  Temp(Src) 98.6 F (37 C)  Wt 197 lb (89.359 kg)  SpO2 98%  LMP 06/20/2015 (Approximate)  Wt Readings from Last 3 Encounters:  10/23/15 197 lb (89.359 kg)  09/11/15 199 lb (90.266 kg)  07/23/15 194 lb (87.998 kg)    Physical Exam  Constitutional: She is oriented to person, place, and time. She appears well-developed and well-nourished. No distress.  HENT:  Head: Normocephalic and atraumatic.  Right Ear: Hearing normal.  Left Ear: Hearing normal.  Nose: Nose normal.  Eyes: Conjunctivae and lids are normal. Right eye exhibits no discharge. Left eye exhibits no discharge. No scleral icterus.  Cardiovascular: Normal rate, regular rhythm, normal heart sounds and intact distal pulses.  Exam reveals no gallop and no friction rub.   No murmur heard. Pulmonary/Chest: Effort normal and breath sounds normal. No respiratory distress. She has no wheezes. She has no rales. She exhibits no tenderness.  Musculoskeletal: Normal range of motion.  Neurological: She is alert and oriented to person, place, and time.  Skin: Skin is warm, dry and intact. Rash noted. There is erythema. No pallor.  Pustular rash on back of R leg  Psychiatric: She has a normal mood and affect. Her speech is normal and behavior is normal. Judgment and thought content normal. Cognition and memory are normal.  Nursing note and vitals reviewed.   Results for orders placed or performed in visit on 09/11/15  Comprehensive metabolic panel  Result Value Ref Range   Glucose 81 65 - 99 mg/dL   BUN 8 6 - 20 mg/dL   Creatinine, Ser 0.64 0.57 - 1.00 mg/dL   GFR calc non Af Amer 118 >59 mL/min/1.73   GFR calc Af Amer 136 >59 mL/min/1.73   BUN/Creatinine Ratio 13 9 - 23   Sodium 136 134 - 144 mmol/L    Potassium 4.0 3.5 - 5.2 mmol/L   Chloride 96 96 - 106 mmol/L   CO2 24 18 - 29 mmol/L   Calcium 9.3 8.7 - 10.2 mg/dL   Total Protein 7.5 6.0 - 8.5 g/dL   Albumin 4.3 3.5 - 5.5 g/dL   Globulin, Total 3.2 1.5 - 4.5 g/dL   Albumin/Globulin Ratio 1.3 1.2 - 2.2   Bilirubin Total 0.2 0.0 - 1.2 mg/dL   Alkaline Phosphatase 104 39 - 117 IU/L   AST 29 0 - 40 IU/L   ALT 29 0 - 32 IU/L  Nicotine/cotinine metabolites  Result Value Ref Range   Nicotine None Detected ng/mL   Cotinine None Detected ng/mL      Assessment & Plan:   Problem List Items Addressed This Visit      Other   ADD (attention deficit disorder) - Primary    Under good control. Refill of meds for 3 months given today. Call with concerns.        Other Visit Diagnoses    Contact dermatitis        Will treat with oral prednisone, triamcinalone given today as well. Call if not getting better or getting worse.     Chronic maxillary sinusitis        Referral to ENT made. Call with concerns.     Relevant Medications    predniSONE (DELTASONE) 10 MG tablet    Other Relevant Orders    Ambulatory referral to ENT    RUQ abdominal pain        Likely due to diaphragm pain. Breathing exercises and heat. Call if not getting better.         Follow up plan: Return in about 3 months (around 01/23/2016) for ADD follow up.

## 2015-11-16 ENCOUNTER — Encounter: Payer: Self-pay | Admitting: Family Medicine

## 2015-11-16 ENCOUNTER — Ambulatory Visit (INDEPENDENT_AMBULATORY_CARE_PROVIDER_SITE_OTHER): Payer: BLUE CROSS/BLUE SHIELD | Admitting: Family Medicine

## 2015-11-16 VITALS — BP 124/82 | HR 75 | Temp 98.0°F | Ht 62.8 in | Wt 200.0 lb

## 2015-11-16 DIAGNOSIS — L259 Unspecified contact dermatitis, unspecified cause: Secondary | ICD-10-CM | POA: Diagnosis not present

## 2015-11-16 DIAGNOSIS — B379 Candidiasis, unspecified: Secondary | ICD-10-CM

## 2015-11-16 MED ORDER — PREDNISONE 10 MG PO TABS: ORAL_TABLET | ORAL | Status: DC

## 2015-11-16 MED ORDER — FLUCONAZOLE 150 MG PO TABS: ORAL_TABLET | ORAL | Status: DC

## 2015-11-16 MED ORDER — CLOBETASOL PROPIONATE 0.05 % EX CREA: 1.0000 "application " | TOPICAL_CREAM | Freq: Two times a day (BID) | CUTANEOUS | Status: DC

## 2015-11-16 NOTE — Progress Notes (Signed)
BP 124/82 mmHg  Pulse 75  Temp(Src) 98 F (36.7 C)  Ht 5' 2.8" (1.595 m)  Wt 200 lb (90.719 kg)  BMI 35.66 kg/m2  SpO2 98%  LMP 11/06/2015 (Exact Date)   Subjective:    Patient ID: Jasmine Tran, female    DOB: 1980-06-03, 35 y.o.   MRN: [id number]  HPI: Jasmine Tran is a 35 y.o. female  Chief Complaint  Patient presents with  . Rash    x 1 month. She was given Prednisone and Triamcinalone last time and it's maybe improved some.   RASH Duration:  1 month- never cleared up after last time. Still really itchy  Location: generalized  Itching: yes Burning: no Redness: no Oozing: no Scaling: yes Blisters: yes Painful: no Fevers: no Change in detergents/soaps/personal care products: no Recent illness: no Recent travel:yes- [place] History of same: no Context: stable Alleviating factors: nothing Treatments attempted:prednisone, hydrocortisone cream, benadryl and lotion/moisturizer Shortness of breath: no  Throat/tongue swelling: no Myalgias/arthralgias: no  Relevant past medical, surgical, family and social history reviewed and updated as indicated. Interim medical history since our last visit reviewed. Allergies and medications reviewed and updated.  Review of Systems  Constitutional: Negative.   Respiratory: Negative.   Cardiovascular: Negative.   Skin: Positive for rash. Negative for color change, pallor and wound.       Perianal itching, rash on her bottom, also has a yeast infection vaginally  Psychiatric/Behavioral: Negative.    Per HPI unless specifically indicated above     Objective:    BP 124/82 mmHg  Pulse 75  Temp(Src) 98 F (36.7 C)  Ht 5' 2.8" (1.595 m)  Wt 200 lb (90.719 kg)  BMI 35.66 kg/m2  SpO2 98%  LMP 11/06/2015 (Exact Date)  Wt Readings from Last 3 Encounters:  11/16/15 200 lb (90.719 kg)  10/23/15 197 lb (89.359 kg)  09/11/15 199 lb (90.266 kg)    Physical Exam  Constitutional: She is oriented to person, place, and time. She  appears well-developed and well-nourished. No distress.  HENT:  Head: Normocephalic and atraumatic.  Right Ear: Hearing normal.  Left Ear: Hearing normal.  Nose: Nose normal.  Eyes: Conjunctivae and lids are normal. Right eye exhibits no discharge. Left eye exhibits no discharge. No scleral icterus.  Pulmonary/Chest: Effort normal. No respiratory distress.  Musculoskeletal: Normal range of motion.  Neurological: She is alert and oriented to person, place, and time.  Skin: Skin is warm, dry and intact. Rash (fine pustular rash on arms, legs and belly) noted. No erythema. No pallor.  Psychiatric: She has a normal mood and affect. Her speech is normal and behavior is normal. Judgment and thought content normal. Cognition and memory are normal.  Nursing note and vitals reviewed.   Results for orders placed or performed in visit on 09/11/15  Comprehensive metabolic panel  Result Value Ref Range   Glucose 81 65 - 99 mg/dL   BUN 8 6 - 20 mg/dL   Creatinine, Ser 0.64 0.57 - 1.00 mg/dL   GFR calc non Af Amer 118 >59 mL/min/1.73   GFR calc Af Amer 136 >59 mL/min/1.73   BUN/Creatinine Ratio 13 9 - 23   Sodium 136 134 - 144 mmol/L   Potassium 4.0 3.5 - 5.2 mmol/L   Chloride 96 96 - 106 mmol/L   CO2 24 18 - 29 mmol/L   Calcium 9.3 8.7 - 10.2 mg/dL   Total Protein 7.5 6.0 - 8.5 g/dL   Albumin 4.3 3.5 - 5.5 g/dL  Globulin, Total 3.2 1.5 - 4.5 g/dL   Albumin/Globulin Ratio 1.3 1.2 - 2.2   Bilirubin Total 0.2 0.0 - 1.2 mg/dL   Alkaline Phosphatase 104 39 - 117 IU/L   AST 29 0 - 40 IU/L   ALT 29 0 - 32 IU/L  Nicotine/cotinine metabolites  Result Value Ref Range   Nicotine None Detected ng/mL   Cotinine None Detected ng/mL      Assessment & Plan:   Problem List Items Addressed This Visit    None    Visit Diagnoses    Contact dermatitis    -  Primary    No better with medication. Will try 2nd course of prednisone and stronger steroid. Will get her into dermatology- referal generated  today.     Relevant Orders    Ambulatory referral to Dermatology    Yeast infection        Will treat with diflucan.    Relevant Medications    fluconazole (DIFLUCAN) 150 MG tablet        Follow up plan: Return As scheduled for ADD.

## 2015-12-19 DIAGNOSIS — J342 Deviated nasal septum: Secondary | ICD-10-CM | POA: Diagnosis not present

## 2015-12-19 DIAGNOSIS — J0100 Acute maxillary sinusitis, unspecified: Secondary | ICD-10-CM | POA: Diagnosis not present

## 2015-12-19 DIAGNOSIS — J340 Abscess, furuncle and carbuncle of nose: Secondary | ICD-10-CM | POA: Diagnosis not present

## 2015-12-28 ENCOUNTER — Telehealth: Payer: Self-pay | Admitting: Family Medicine

## 2015-12-28 DIAGNOSIS — D134 Benign neoplasm of liver: Secondary | ICD-10-CM

## 2015-12-28 NOTE — Telephone Encounter (Signed)
Routing to provider  

## 2015-12-28 NOTE — Telephone Encounter (Signed)
Referral put in. She can go to either [facility] or [facility]-- up to her.

## 2015-12-28 NOTE — Telephone Encounter (Signed)
Pt called would like a referral to GI doctor in the [place] area. Please call pt back for more information. Thanks.

## 2015-12-28 NOTE — Telephone Encounter (Signed)
Sending patient to [facility].  She requested [employer].  LB is in [place].  Thanks!

## 2015-12-29 ENCOUNTER — Emergency Department: Payer: BLUE CROSS/BLUE SHIELD

## 2015-12-29 ENCOUNTER — Other Ambulatory Visit: Payer: Self-pay

## 2015-12-29 ENCOUNTER — Emergency Department
Admission: EM | Admit: 2015-12-29 | Discharge: 2015-12-29 | Disposition: A | Discharge status: Home / Self Care | Payer: BLUE CROSS/BLUE SHIELD | Attending: Emergency Medicine | Admitting: Emergency Medicine

## 2015-12-29 DIAGNOSIS — F909 Attention-deficit hyperactivity disorder, unspecified type: Secondary | ICD-10-CM | POA: Diagnosis not present

## 2015-12-29 DIAGNOSIS — R1011 Right upper quadrant pain: Secondary | ICD-10-CM | POA: Insufficient documentation

## 2015-12-29 DIAGNOSIS — Z87891 Personal history of nicotine dependence: Secondary | ICD-10-CM | POA: Diagnosis not present

## 2015-12-29 DIAGNOSIS — Z79899 Other long term (current) drug therapy: Secondary | ICD-10-CM | POA: Diagnosis not present

## 2015-12-29 DIAGNOSIS — R1013 Epigastric pain: Secondary | ICD-10-CM | POA: Diagnosis not present

## 2015-12-29 LAB — COMPREHENSIVE METABOLIC PANEL
ALT: 29 U/L (ref 14–54)
AST: 24 U/L (ref 15–41)
Albumin: 4.2 g/dL (ref 3.5–5.0)
Alkaline Phosphatase: 107 U/L (ref 38–126)
Anion gap: 9 (ref 5–15)
BUN: 10 mg/dL (ref 6–20)
CALCIUM: 8.9 mg/dL (ref 8.9–10.3)
CHLORIDE: 104 mmol/L (ref 101–111)
CO2: 24 mmol/L (ref 22–32)
CREATININE: 0.88 mg/dL (ref 0.44–1.00)
Glucose, Bld: 91 mg/dL (ref 65–99)
POTASSIUM: 3.9 mmol/L (ref 3.5–5.1)
SODIUM: 137 mmol/L (ref 135–145)
TOTAL PROTEIN: 7.4 g/dL (ref 6.5–8.1)
Total Bilirubin: 0.3 mg/dL (ref 0.3–1.2)

## 2015-12-29 LAB — PREGNANCY, URINE: Preg Test, Ur: NEGATIVE

## 2015-12-29 LAB — CBC WITH DIFFERENTIAL/PLATELET
BASOS ABS: 0 10*3/uL (ref 0–0.1)
Basophils Relative: 0 %
EOS PCT: 1 %
Eosinophils Absolute: 0.1 10*3/uL (ref 0–0.7)
HEMATOCRIT: 42.2 % (ref 35.0–47.0)
Hemoglobin: 14.6 g/dL (ref 12.0–16.0)
LYMPHS PCT: 42 %
Lymphs Abs: 3.9 10*3/uL — ABNORMAL HIGH (ref 1.0–3.6)
MCH: 30.5 pg (ref 26.0–34.0)
MCHC: 34.5 g/dL (ref 32.0–36.0)
MCV: 88.2 fL (ref 80.0–100.0)
MONO ABS: 0.6 10*3/uL (ref 0.2–0.9)
Monocytes Relative: 6 %
Neutro Abs: 4.6 10*3/uL (ref 1.4–6.5)
Neutrophils Relative %: 51 %
Platelets: 283 10*3/uL (ref 150–440)
RBC: 4.79 MIL/uL (ref 3.80–5.20)
RDW: 13.7 % (ref 11.5–14.5)
WBC: 9.3 10*3/uL (ref 3.6–11.0)

## 2015-12-29 LAB — URINALYSIS COMPLETE WITH MICROSCOPIC (ARMC ONLY)
BILIRUBIN URINE: NEGATIVE
Glucose, UA: NEGATIVE mg/dL
Ketones, ur: NEGATIVE mg/dL
LEUKOCYTES UA: NEGATIVE
Nitrite: NEGATIVE
PH: 7 (ref 5.0–8.0)
PROTEIN: NEGATIVE mg/dL
RBC / HPF: NONE SEEN RBC/hpf (ref 0–5)
SQUAMOUS EPITHELIAL / LPF: NONE SEEN
Specific Gravity, Urine: 1.01 (ref 1.005–1.030)

## 2015-12-29 LAB — LIPASE, BLOOD: LIPASE: 36 U/L (ref 11–51)

## 2015-12-29 MED ORDER — OXYCODONE-ACETAMINOPHEN 5-325 MG PO TABS: 1.0000 | ORAL_TABLET | Freq: Four times a day (QID) | ORAL | 0 refills | Status: DC | PRN

## 2015-12-29 MED ORDER — SODIUM CHLORIDE 0.9 % IV BOLUS (SEPSIS)
1000.0000 mL | Freq: Once | INTRAVENOUS | Status: AC
  Administered 2015-12-29: 1000 mL via INTRAVENOUS

## 2015-12-29 MED ORDER — ONDANSETRON HCL 4 MG PO TABS: 4.0000 mg | ORAL_TABLET | Freq: Every day | ORAL | 0 refills | Status: DC | PRN

## 2015-12-29 MED ORDER — HYDROMORPHONE HCL 1 MG/ML IJ SOLN
1.0000 mg | Freq: Once | INTRAMUSCULAR | Status: AC
  Administered 2015-12-29: 1 mg via INTRAVENOUS
  Filled 2015-12-29: qty 1

## 2015-12-29 MED ORDER — DIATRIZOATE MEGLUMINE & SODIUM 66-10 % PO SOLN
15.0000 mL | Freq: Once | ORAL | Status: AC
  Administered 2015-12-29: 15 mL via ORAL

## 2015-12-29 MED ORDER — IOPAMIDOL (ISOVUE-300) INJECTION 61%
100.0000 mL | Freq: Once | INTRAVENOUS | Status: AC | PRN
  Administered 2015-12-29: 100 mL via INTRAVENOUS

## 2015-12-29 MED ORDER — ONDANSETRON HCL 4 MG/2ML IJ SOLN
4.0000 mg | Freq: Once | INTRAMUSCULAR | Status: AC
  Administered 2015-12-29: 4 mg via INTRAVENOUS
  Filled 2015-12-29: qty 2

## 2015-12-29 NOTE — ED Provider Notes (Addendum)
[facility] Emergency Department Provider Note  ____________________________________________   I have reviewed the triage vital signs and the nursing notes.   HISTORY  Chief Complaint Abdominal Pain; Nausea; and Emesis    HPI Jasmine Tran is a 35 y.o. female who had surgery in February to remove what she describes as benign liver cysts presents today with epigastric and right upper quadrant abdominal pain. Patient states that it is significant. Has been going on since Monday. Today is Saturday. She states she is try to get in to see her surgeon but they did not offer her any appointment until the 16th. Patient states the pain became worse. She had some nonbloody nonbilious vomiting today. Denies diarrhea. Has been having normal bowel movements to the best of her recollection. Denies fever. Pain is significant. Patient alleges an "allergy" to morphine and states that she can take Dilaudid, however, with no difficulty.. Pain is sharp radiates the right nothing makes it better nothing makes it worse     Past Medical History:  Diagnosis Date  . ADD (attention deficit disorder)   . Bipolar 1 disorder with moderate mania ([place])     Patient Active Problem List   Diagnosis Date Noted  . PCOS (polycystic ovarian syndrome) 04/19/2015  . Adenoma of liver 04/16/2015  . Elevated alkaline phosphatase level 04/16/2015  . Hematuria 03/30/2015  . Bipolar 1 disorder with moderate mania ([place])   . ADD (attention deficit disorder)     Past Surgical History:  Procedure Laterality Date  . APPENDECTOMY    . CHOLECYSTECTOMY    . TUMOR REMOVAL     Liver, benign    Current Outpatient Rx  . Order #: [id number] Class: Normal  . Order #: [id number] Class: Normal  . Order #: [id number] Class: Normal  . Order #: [id number] Class: Historical Med  . Order #: [id number] Class: Normal  . Order #: [id number] Class: Print  . Order #: [id number] Class: Normal  . Order #: [id number] Class: Print     Allergies Tape; Doxycycline; Morphine and related; Phenergan [promethazine hcl]; Sertraline; and Prednisone  Family History  Problem Relation Age of Onset  . Heart disease Mother   . Heart disease Father   . Cancer Neg Hx   . COPD Neg Hx   . Diabetes Neg Hx   . Hypertension Neg Hx   . Stroke Neg Hx     Social History Social History  Substance Use Topics  . Smoking status: Former Smoker    Packs/day: 0.25    Types: Cigarettes  . Smokeless tobacco: Never Used  . Alcohol use No    Review of Systems }Constitutional: No fever/chills Eyes: No visual changes. ENT: No sore throat. No stiff neck no neck pain Cardiovascular: Denies chest pain. Respiratory: Denies shortness of breath. Gastrointestinal:   Positive vomiting.  No diarrhea.  No constipation. Genitourinary: Negative for dysuria. Musculoskeletal: Negative lower extremity swelling Skin: Negative for rash. Neurological: Negative for severe headaches, focal weakness or numbness. 10-point ROS otherwise negative.  ____________________________________________   PHYSICAL EXAM:  VITAL SIGNS: ED Triage Vitals  Enc Vitals Group     BP 12/29/15 0106 134/77     Pulse Rate 12/29/15 0106 84     Resp 12/29/15 0106 18     Temp 12/29/15 0106 98 F (36.7 C)     Temp Source 12/29/15 0106 Oral     SpO2 12/29/15 0106 98 %     Weight 12/29/15 0107 199 lb (90.3 kg)     Height 12/29/15 0107 5\' 3"  (1.6  m)     Head Circumference --      Peak Flow --      Pain Score 12/29/15 0107 7     Pain Loc --      Pain Edu? --      Excl. in [place]? --     Constitutional: Alert and oriented. Well appearing and in no acute distress. Eyes: Conjunctivae are normal. PERRL. EOMI. Head: Atraumatic. Nose: No congestion/rhinnorhea. Mouth/Throat: Mucous membranes are moist.  Oropharynx non-erythematous. Neck: No stridor.   Nontender with no meningismus Cardiovascular: Normal rate, regular rhythm. Grossly normal heart sounds.  Good peripheral  circulation. Respiratory: Normal respiratory effort.  No retractions. Lungs CTAB. Abdominal: Tenderness to palpation in the epigastric region, soft, voluntary guarding but no rebound. Nonsurgical abdomen.. No distention.  Back:  There is no focal tenderness or step off.  there is no midline tenderness there are no lesions noted. there is no CVA tenderness Musculoskeletal: No lower extremity tenderness, no upper extremity tenderness. No joint effusions, no DVT signs strong distal pulses no edema Neurologic:  Normal speech and language. No gross focal neurologic deficits are appreciated.  Skin:  Skin is warm, dry and intact. No rash noted. Psychiatric: Mood and affect are normal. Speech and behavior are normal.  ____________________________________________   LABS (all labs ordered are listed, but only abnormal results are displayed)  Labs Reviewed  CBC WITH DIFFERENTIAL/PLATELET - Abnormal; Notable for the following:       Result Value   Lymphs Abs 3.9 (*)    All other components within normal limits  URINALYSIS COMPLETEWITH MICROSCOPIC ([facility] ONLY) - Abnormal; Notable for the following:    Color, Urine STRAW (*)    APPearance CLEAR (*)    Hgb urine dipstick 1+ (*)    Bacteria, UA RARE (*)    All other components within normal limits  COMPREHENSIVE METABOLIC PANEL  LIPASE, BLOOD  PREGNANCY, URINE  POC URINE PREG, ED   ____________________________________________  EKG  I personally interpreted any EKGs ordered by me or triage Sinus rhythm rate 79 bpm no acute ST elevation or acute ST depression unremarkable EKG ____________________________________________  RADIOLOGY  I reviewed any imaging ordered by me or triage that were performed during my shift and, if possible, patient and/or family made aware of any abnormal findings. ____________________________________________   PROCEDURES  Procedure(s) performed: None  Procedures  Critical Care performed:  None  ____________________________________________   INITIAL IMPRESSION / ASSESSMENT AND PLAN / ED COURSE  Pertinent labs & imaging results that were available during my care of the patient were reviewed by me and considered in my medical decision making (see chart for details).  Patient here with abdominal pain for 5 or 6 days. She has not been able to see her primary care doctor or her surgeon for this apparently. She has a intolerance of morphine but can take Dilaudid. Blood work is reassuring vital signs are reassuring unfortunately because of her recent surgical history she will acquire imaging in my estimation. Patient has requested repeat dosing of Dilaudid while she is here.  ----------------------------------------- 5:32 AM on 12/29/2015 -----------------------------------------  Reassuring images reassuring vital signs reassuring blood work reassuring exam. Abdomen is benign at this time. Discussed with Dr. Truddie Hidden, her surgeon, who knows her quite well. We discussed her blood work findings and CT findings and exam findings as well as her history etc. He feels she is safe for discharge at this time there does not appear to be any other acute intervention required. I  reassured the patient. Return precautions and follow-up given and understood. Patient states she is not allergic to oxycodone and would like to have some oxycodone in case she has breakthrough pain prior to seeing her surgeon which we will give her.  Clinical Course   ____________________________________________   FINAL CLINICAL IMPRESSION(S) / ED DIAGNOSES  Final diagnoses:  None      This chart was dictated using voice recognition software.  Despite best efforts to proofread,  errors can occur which can change meaning.      Schuyler Amor, MD 12/29/15 0302    Schuyler Amor, MD 12/29/15 [phone]

## 2015-12-29 NOTE — ED Notes (Signed)
Pt. Going home by self, will follow-up with surgeon.

## 2015-12-29 NOTE — ED Triage Notes (Signed)
Pt ambulatory to triage with no difficulty. Pt reports she had liver resection on 07/05/15 with the removal of two beneign tumors. Pt reports she developed discomfort to her right upper abd and flank region about 5 days ago. Pt reports she called her surgeon but can not see him until 03/03/16. Pt reports tonight the pain is worse and she is having nausea and vomiting.

## 2015-12-29 NOTE — ED Notes (Signed)
Pt. States [place] in [place] this [place].  Pt. States mid abdominal pain that radiates to rt. Flank.  Pt. States pain increases with palpation.

## 2016-01-09 DIAGNOSIS — J349 Unspecified disorder of nose and nasal sinuses: Secondary | ICD-10-CM | POA: Diagnosis not present

## 2016-01-09 DIAGNOSIS — J301 Allergic rhinitis due to pollen: Secondary | ICD-10-CM | POA: Diagnosis not present

## 2016-01-09 DIAGNOSIS — J342 Deviated nasal septum: Secondary | ICD-10-CM | POA: Diagnosis not present

## 2016-01-21 ENCOUNTER — Emergency Department: Payer: BLUE CROSS/BLUE SHIELD

## 2016-01-21 ENCOUNTER — Emergency Department
Admission: EM | Admit: 2016-01-21 | Discharge: 2016-01-21 | Disposition: A | Discharge status: Home / Self Care | Payer: BLUE CROSS/BLUE SHIELD | Attending: Emergency Medicine | Admitting: Emergency Medicine

## 2016-01-21 ENCOUNTER — Encounter: Payer: Self-pay | Admitting: *Deleted

## 2016-01-21 DIAGNOSIS — R079 Chest pain, unspecified: Secondary | ICD-10-CM | POA: Insufficient documentation

## 2016-01-21 DIAGNOSIS — F909 Attention-deficit hyperactivity disorder, unspecified type: Secondary | ICD-10-CM | POA: Diagnosis not present

## 2016-01-21 DIAGNOSIS — Z87891 Personal history of nicotine dependence: Secondary | ICD-10-CM | POA: Diagnosis not present

## 2016-01-21 DIAGNOSIS — R0789 Other chest pain: Secondary | ICD-10-CM | POA: Diagnosis not present

## 2016-01-21 DIAGNOSIS — R200 Anesthesia of skin: Secondary | ICD-10-CM | POA: Diagnosis present

## 2016-01-21 LAB — TROPONIN I
Troponin I: <0.03 ng/mL (ref <0.03)
Troponin I: <0.03 ng/mL (ref <0.03)

## 2016-01-21 LAB — POC URINE PREG, ED
Preg Test, Ur: NEGATIVE
Preg Test, Ur: NEGATIVE

## 2016-01-21 LAB — CBC
HCT: 41.4 % (ref 35.0–47.0)
HEMOGLOBIN: 14.1 g/dL (ref 12.0–16.0)
MCH: 30.8 pg (ref 26.0–34.0)
MCHC: 34 g/dL (ref 32.0–36.0)
MCV: 90.5 fL (ref 80.0–100.0)
Platelets: 257 10*3/uL (ref 150–440)
RBC: 4.58 MIL/uL (ref 3.80–5.20)
RDW: 13.4 % (ref 11.5–14.5)
WBC: 6.8 10*3/uL (ref 3.6–11.0)

## 2016-01-21 LAB — BASIC METABOLIC PANEL
ANION GAP: 9 (ref 5–15)
BUN: 8 mg/dL (ref 6–20)
CHLORIDE: 107 mmol/L (ref 101–111)
CO2: 20 mmol/L — AB (ref 22–32)
Calcium: 9.2 mg/dL (ref 8.9–10.3)
Creatinine, Ser: 0.67 mg/dL (ref 0.44–1.00)
GFR calc non Af Amer: >60 mL/min (ref >60)
Glucose, Bld: 88 mg/dL (ref 65–99)
POTASSIUM: 3.6 mmol/L (ref 3.5–5.1)
Sodium: 136 mmol/L (ref 135–145)

## 2016-01-21 MED ORDER — DIAZEPAM 5 MG/ML IJ SOLN
2.0000 mg | Freq: Once | INTRAMUSCULAR | Status: AC
  Administered 2016-01-21: 2 mg via INTRAVENOUS

## 2016-01-21 MED ORDER — ONDANSETRON 4 MG PO TBDP
ORAL_TABLET | ORAL | Status: AC
  Administered 2016-01-21: 4 mg via ORAL
  Filled 2016-01-21: qty 1

## 2016-01-21 MED ORDER — FENTANYL CITRATE (PF) 100 MCG/2ML IJ SOLN
50.0000 ug | Freq: Once | INTRAMUSCULAR | Status: AC
  Administered 2016-01-21: 50 ug via INTRAVENOUS
  Filled 2016-01-21: qty 2

## 2016-01-21 MED ORDER — DIAZEPAM 5 MG/ML IJ SOLN
INTRAMUSCULAR | Status: AC
  Administered 2016-01-21: 2 mg via INTRAVENOUS
  Filled 2016-01-21: qty 2

## 2016-01-21 MED ORDER — IOPAMIDOL (ISOVUE-370) INJECTION 76%
75.0000 mL | Freq: Once | INTRAVENOUS | Status: AC | PRN
  Administered 2016-01-21: 75 mL via INTRAVENOUS

## 2016-01-21 MED ORDER — SODIUM CHLORIDE 0.9 % IV BOLUS (SEPSIS)
500.0000 mL | Freq: Once | INTRAVENOUS | Status: AC
  Administered 2016-01-21: 500 mL via INTRAVENOUS

## 2016-01-21 MED ORDER — ONDANSETRON 4 MG PO TBDP
4.0000 mg | ORAL_TABLET | Freq: Once | ORAL | Status: AC
  Administered 2016-01-21: 4 mg via ORAL

## 2016-01-21 MED ORDER — ONDANSETRON HCL 4 MG/2ML IJ SOLN
4.0000 mg | Freq: Once | INTRAMUSCULAR | Status: AC
  Administered 2016-01-21: 4 mg via INTRAVENOUS
  Filled 2016-01-21: qty 2

## 2016-01-21 MED ORDER — KETOROLAC TROMETHAMINE 30 MG/ML IJ SOLN
INTRAMUSCULAR | Status: AC
  Administered 2016-01-21: 30 mg via INTRAVENOUS
  Filled 2016-01-21: qty 1

## 2016-01-21 MED ORDER — KETOROLAC TROMETHAMINE 30 MG/ML IJ SOLN
30.0000 mg | Freq: Once | INTRAMUSCULAR | Status: AC
  Administered 2016-01-21: 30 mg via INTRAVENOUS

## 2016-01-21 NOTE — ED Notes (Signed)
Pt. Assisted/ unhooked and Up to restroom by Ronalee Belts, ED Medic. No complications. Pt. Resting back in bed at this time requesting additional pain medication.

## 2016-01-21 NOTE — ED Notes (Signed)
Pt. Verbalizes understanding of d/c instructions, and follow-up. VS stable and pain controlled per pt.  Pt. In NAD at time of d/c and denies further concerns regarding this visit. Pt. Stable at the time of departure from the unit, departing unit by the safest and most appropriate manner per that pt condition and limitations. Pt advised to return to the ED at any time for emergent concerns, or for new/worsening symptoms.   

## 2016-01-21 NOTE — ED Provider Notes (Signed)
[facility] Emergency Department Provider Note   ____________________________________________   First MD Initiated Contact with Patient 01/21/16 1756     (approximate)  I have reviewed the triage vital signs and the nursing notes.   HISTORY  Chief Complaint Chest Pain   HPI Jasmine Tran is a 35 y.o. female with a history of ADD and bipolar disorder who is presenting to the emergency department with sudden onset left-sided chest pain that started a half hour prior to arrival. She denies feeling anxious. She says that the pain feels like a pressure that is an 8 out of 10. She says it is radiating to her left arm and also has numbness to left side of her face. She denies any radiation to her back. She denies being on any hormone supplementation such as birth control. Says she has never had pain like this in the past. Says that she was nauseous but did not vomit. Also says that it hurts to breathe and also hurts worse when she moves her left arm. Denies any recent heavy lifting or bending. Says that she has a family history of heart disease with her mother with heart disease in her 25s.   Past Medical History:  Diagnosis Date  . ADD (attention deficit disorder)   . Bipolar 1 disorder with moderate mania ([place])     Patient Active Problem List   Diagnosis Date Noted  . PCOS (polycystic ovarian syndrome) 04/19/2015  . Adenoma of liver 04/16/2015  . Elevated alkaline phosphatase level 04/16/2015  . Hematuria 03/30/2015  . Bipolar 1 disorder with moderate mania ([place])   . ADD (attention deficit disorder)     Past Surgical History:  Procedure Laterality Date  . APPENDECTOMY    . CHOLECYSTECTOMY    . TUMOR REMOVAL     Liver, benign    Prior to Admission medications   Medication Sig Start Date End Date Taking? Authorizing Provider  clobetasol cream (TEMOVATE) [id number] % Apply 1 application topically 2 (two) times daily. 11/16/15   Megan P Johnson, DO    EPINEPHrine (EPIPEN 2-PAK) 0.3 mg/0.3 mL IJ SOAJ injection Inject 0.3 mLs (0.3 mg total) into the muscle once. 10/23/15   Megan P Johnson, DO  fluconazole (DIFLUCAN) 150 MG tablet 1 tab once, may repeat in 1 day if not better 11/16/15   Megan P Johnson, DO  L-Methylfolate-Algae (DEPLIN 15) 15-90.314 MG CAPS TAKE 1 CAPSULE BY MOUTH EVERY DAY 12/18/14   Historical Provider, MD  lamoTRIgine (LAMICTAL) 200 MG tablet TAKE 1 TABLET DAILY 10/08/15   Megan P Johnson, DO  lisdexamfetamine (VYVANSE) 60 MG capsule Take 1 capsule (60 mg total) by mouth daily. 10/23/15   Megan P Johnson, DO  ondansetron (ZOFRAN) 4 MG tablet Take 1 tablet (4 mg total) by mouth daily as needed for nausea or vomiting. 12/29/15   Schuyler Amor, MD  oxcarbazepine (TRILEPTAL) 600 MG tablet TAKE 1 TABLET TWICE A DAY 10/08/15   Megan P Johnson, DO  oxyCODONE-acetaminophen (ROXICET) 5-325 MG tablet Take 1 tablet by mouth every 6 (six) hours as needed. 12/29/15   Schuyler Amor, MD  predniSONE (DELTASONE) 10 MG tablet 6 tabs today, 5 tabs tomorrow, decrease by 1 every day until gone 11/16/15   Megan P Johnson, DO    Allergies Tape; Doxycycline; Morphine and related; Phenergan [promethazine hcl]; Sertraline; and Prednisone  Family History  Problem Relation Age of Onset  . Heart disease Mother   . Heart disease Father   .  Cancer Neg Hx   . COPD Neg Hx   . Diabetes Neg Hx   . Hypertension Neg Hx   . Stroke Neg Hx     Social History Social History  Substance Use Topics  . Smoking status: Former Smoker    Packs/day: 0.25    Types: Cigarettes  . Smokeless tobacco: Never Used  . Alcohol use No    Review of Systems Constitutional: No fever/chills Eyes: No visual changes. ENT: No sore throat. Cardiovascular: As above Respiratory: As above Gastrointestinal: No abdominal pain.  No nausea, no vomiting.   Genitourinary: Negative for dysuria. Musculoskeletal: Negative for back pain. Skin: Negative for rash. Neurological: Negative  for headaches, focal weakness or numbness.  10-point ROS otherwise negative.  ____________________________________________   PHYSICAL EXAM:  VITAL SIGNS: ED Triage Vitals [01/21/16 1746]  Enc Vitals Group     BP 123/75     Pulse Rate 78     Resp 18     Temp 97.6 F (36.4 C)     Temp Source Oral     SpO2 100 %     Weight 200 lb (90.7 kg)     Height 5\' 3"  (1.6 m)     Head Circumference      Peak Flow      Pain Score 7     Pain Loc      Pain Edu?      Excl. in [place]?     Constitutional: Alert and oriented. Well appearing and in no acute distress. Eyes: Conjunctivae are normal. PERRL. EOMI. Head: Atraumatic. Nose: No congestion/rhinnorhea. Mouth/Throat: Mucous membranes are moist.   Neck: No stridor.   Cardiovascular: Normal rate, regular rhythm. Grossly normal heart sounds.  Good peripheral circulation With intact, bilateral and equal radial as well as dorsalis pedis pulses. Chest pain is not reproducible to palpation. Respiratory: Normal respiratory effort.  No retractions. Lungs CTAB. Gastrointestinal: Soft with mild right upper quadrant tenderness palpation which the patient says is chronic since having adenomas removed earlier this year. No distention. Musculoskeletal: No lower extremity tenderness nor edema.  No joint effusions. Neurologic:  Normal speech and language. No gross focal neurologic deficits are appreciated. Skin:  Skin is warm, dry and intact. No rash noted. Psychiatric: Mood and affect are normal. Speech and behavior are normal.  ____________________________________________   LABS (all labs ordered are listed, but only abnormal results are displayed)  Labs Reviewed  BASIC METABOLIC PANEL - Abnormal; Notable for the following:       Result Value   CO2 20 (*)    All other components within normal limits  CBC  TROPONIN I  TROPONIN I  POC URINE PREG, ED   ____________________________________________  EKG  ED ECG REPORT I, Doran Stabler,  the attending physician, personally viewed and interpreted this ECG.   Date: 01/21/2016  EKG Time: 1742  Rate: 76  Rhythm: normal sinus rhythm  Axis: Normal axis  Intervals:none  ST&T Change: No ST segment elevation or depression. No abnormal T-wave inversion.  ____________________________________________  RADIOLOGY  CT Angio Chest Aorta W and/or Wo Contrast (Accession [id number]) (Order [id number])  Imaging  Date: 01/21/2016 Department: [facility] EMERGENCY DEPARTMENT Released By/Authorizing: Orbie Pyo, MD (auto-released)  PACS Images   Show images for CT Angio Chest Aorta W and/or Wo Contrast  Study Result   CLINICAL DATA:  Left-sided chest pain with tightness in the jaw and left arm. Question aortic dissection.  EXAM: CT ANGIOGRAPHY CHEST WITH CONTRAST  TECHNIQUE: Multidetector CT imaging of the chest was performed using the standard protocol during bolus administration of intravenous contrast. Multiplanar CT image reconstructions and MIPs were obtained to evaluate the vascular anatomy.  CONTRAST:  75 cc Isovue 370.  COMPARISON:  PA and lateral chest this same day.  FINDINGS: There is no aortic dissection or aneurysm. The patient has a normal three-vessel arch configuration. The pulmonary arteries are not sufficiently opacified to evaluate for embolus. No axillary, hilar or mediastinal lymphadenopathy. No pleural or pericardial effusion. Mild atelectasis or scar is seen in the right middle lobe. A just superior to the minor fissure on the right, there is a 0.5 x 0.5 cm nodule on image 28 of series 7. The lungs are otherwise unremarkable.  Incidentally imaged upper abdomen demonstrates postoperative change of cholecystectomy. No lytic or sclerotic bony lesion is identified. Schmorl's nodes in the thoracic spine are noted.  Review of the MIP images confirms the above findings.  IMPRESSION: Negative for aortic dissection  or aneurysm.  No acute abnormality.  0.5 cm ground-glass attenuating nodule in the right middle lobe. No follow-up recommended. This recommendation follows the consensus statement: Guidelines for Management of Incidental Pulmonary Nodules Detected on CT Images:From the Fleischner Society 2017; published online before print (10.1148/radiol.IJ:2314499).   Electronically Signed   By: Inge Rise M.D.   On: 01/21/2016 20:02     ____________________________________________   PROCEDURES  Procedure(s) performed:   Procedures  Critical Care performed:   ____________________________________________   INITIAL IMPRESSION / ASSESSMENT AND PLAN / ED COURSE  Pertinent labs & imaging results that were available during my care of the patient were reviewed by me and considered in my medical decision making (see chart for details).  ----------------------------------------- 9:46 PM on 01/21/2016 -----------------------------------------  Patient continues to be in no acute distress. Very reassuring workup including 2 negative troponins. CT angiography of the aorta did not reveal any pathology. PERC negative.  Patient denies being on any hormone supplements. Patient without any pain relief despite morphine as well as Toradol and Valium. However, she is, room. Her vital signs remain stable and within normal limits.  I do not feel that the patient requires admission at this time because of her very reassuring workup. However, she does have cardiac risk factors because her family history. I discussed with the patient discharged home with follow-up in the office with cardiology and she says that she feels that this plan is reasonable and she is understanding and willing to comply. Usual and customary return precautions given.  Clinical Course     ____________________________________________   FINAL CLINICAL IMPRESSION(S) / ED DIAGNOSES  Chest pain.    NEW MEDICATIONS STARTED  DURING THIS VISIT:  New Prescriptions   No medications on file     Note:  This document was prepared using Dragon voice recognition software and may include unintentional dictation errors.    Orbie Pyo, MD 01/21/16 [phone]

## 2016-01-21 NOTE — ED Notes (Signed)
MD at bedside. 

## 2016-01-21 NOTE — ED Notes (Addendum)
Pt. Reporting feeling as if there is "risidual medicine left in vein in arm where IV was" after IV catheter removed by this RN. This RN thoroughly inspected area and no sx of infiltration or phlebitis visible, pt. Did not verbalized complications during IV administration of meds when asked on each administration. This RN instructed pt. On home tx for suspected phlebitis/infiltration. Pt. Verbalized understanding.

## 2016-01-21 NOTE — ED Notes (Signed)
Pt. Returned to tx. room in stable condition with no acute changes since departure from unit for scans.   

## 2016-01-21 NOTE — ED Triage Notes (Signed)
Pt states chest pain that began 30 minutes ago, left sided pain, states tightness in her jaw and arm, pt clenching chest and breathing fast during triage

## 2016-01-25 ENCOUNTER — Encounter: Payer: Self-pay | Admitting: Family Medicine

## 2016-01-25 ENCOUNTER — Ambulatory Visit (INDEPENDENT_AMBULATORY_CARE_PROVIDER_SITE_OTHER): Payer: BLUE CROSS/BLUE SHIELD | Admitting: Family Medicine

## 2016-01-25 VITALS — BP 114/78 | HR 94 | Temp 98.4°F | Ht 62.8 in | Wt 203.0 lb

## 2016-01-25 DIAGNOSIS — E282 Polycystic ovarian syndrome: Secondary | ICD-10-CM | POA: Diagnosis not present

## 2016-01-25 DIAGNOSIS — F988 Other specified behavioral and emotional disorders with onset usually occurring in childhood and adolescence: Secondary | ICD-10-CM

## 2016-01-25 DIAGNOSIS — R079 Chest pain, unspecified: Secondary | ICD-10-CM | POA: Diagnosis not present

## 2016-01-25 DIAGNOSIS — F909 Attention-deficit hyperactivity disorder, unspecified type: Secondary | ICD-10-CM

## 2016-01-25 MED ORDER — LISDEXAMFETAMINE DIMESYLATE 50 MG PO CAPS: 50.0000 mg | ORAL_CAPSULE | Freq: Every day | ORAL | 0 refills | Status: DC

## 2016-01-25 NOTE — Assessment & Plan Note (Signed)
Discussed medications to help her lose weight and control PCOS, she will review and let us know what she wants to do.

## 2016-01-25 NOTE — Patient Instructions (Addendum)
Polycystic Ovarian Syndrome Polycystic ovarian syndrome (PCOS) is a common hormonal disorder among women of reproductive age. Most women with PCOS grow many small cysts on their ovaries. PCOS can cause problems with your periods and make it difficult to get pregnant. It can also cause an increased risk of miscarriage with pregnancy. If left untreated, PCOS can lead to serious health problems, such as diabetes and heart disease. CAUSES The cause of PCOS is not fully understood, but genetics may be a factor. SIGNS AND SYMPTOMS   Infrequent or no menstrual periods.   Inability to get pregnant (infertility) because of not ovulating.   Increased growth of hair on the face, chest, stomach, back, thumbs, thighs, or toes.   Acne, oily skin, or dandruff.   Pelvic pain.   Weight gain or obesity, usually carrying extra weight around the waist.   Type 2 diabetes.   High cholesterol.   High blood pressure.   Female-pattern baldness or thinning hair.   Patches of thickened and dark brown or black skin on the neck, arms, breasts, or thighs.   Tiny excess flaps of skin (skin tags) in the armpits or neck area.   Excessive snoring and having breathing stop at times while asleep (sleep apnea).   Deepening of the voice.   Gestational diabetes when pregnant.  DIAGNOSIS  There is no single test to diagnose PCOS.   Your health care provider will:   Take a medical history.   Perform a pelvic exam.   Have ultrasonography done.   Check your female and female hormone levels.   Measure glucose or sugar levels in the blood.   Do other blood tests.   If you are producing too many female hormones, your health care provider will make sure it is from PCOS. At the physical exam, your health care provider will want to evaluate the areas of increased hair growth. Try to allow natural hair growth for a few days before the visit.   During a pelvic exam, the ovaries may be enlarged  or swollen because of the increased number of small cysts. This can be seen more easily by using vaginal ultrasonography or screening to examine the ovaries and lining of the uterus (endometrium) for cysts. The uterine lining may become thicker if you have not been having a regular period.  TREATMENT  Because there is no cure for PCOS, it needs to be managed to prevent problems. Treatments are based on your symptoms. Treatment is also based on whether you want to have a baby or whether you need contraception.  Treatment may include:   Progesterone hormone to start a menstrual period.   Birth control pills to make you have regular menstrual periods.   Medicines to make you ovulate, if you want to get pregnant.   Medicines to control your insulin.   Medicine to control your blood pressure.   Medicine and diet to control your high cholesterol and triglycerides in your blood.  Medicine to reduce excessive hair growth.  Surgery, making small holes in the ovary, to decrease the amount of female hormone production. This is done through a long, lighted tube (laparoscope) placed into the pelvis through a tiny incision in the lower abdomen.  HOME CARE INSTRUCTIONS  Only take over-the-counter or prescription medicine as directed by your health care provider.  Pay attention to the foods you eat and your activity levels. This can help reduce the effects of PCOS.  Keep your weight under control.  Eat foods that are   low in carbohydrate and high in fiber.  Exercise regularly. SEEK MEDICAL CARE IF:  Your symptoms do not get better with medicine.  You have new symptoms.   This information is not intended to replace advice given to you by your health care provider. Make sure you discuss any questions you have with your health care provider.   Document Released: 08/29/2004 Document Revised: 02/23/2013 Document Reviewed: 10/21/2012 Elsevier Interactive Patient Education 2016 [employer].    Patient education: Polycystic ovary syndrome (PCOS) (Beyond the Basics) Authors: Bonnita Levan, MD Crist Infante, MD Section Editors: Carroll Sage, MD Malva Cogan, MD Deputy Editor: Jarrett Ables, MD Contributor Disclosures All topics are updated as new evidence becomes available and our peer review process is complete. Literature review current through:Aug 2017.This topic last updated:Sep 25, 2015. PCOS OVERVIEW-Polycystic ovary syndrome (PCOS) is a condition that causes irregular menstrual periods because monthly ovulation is not occurring and levels of androgens (female hormones) in women are elevated. The condition occurs in approximately 5 to 10 percent of women. The elevated androgen levels can sometimes cause excessive facial hair growth, acne, and/or female-pattern scalp hair thinning. Most, but not all, women with PCOS are overweight or obese, and they are at higher-than-average risk of developing diabetes and obstructive sleep apnea. For women with PCOS who want to become pregnant, fertility pills or injections are often needed to help women ovulate. Although PCOS is not completely reversible, there are a number of treatments that can reduce or minimize bothersome symptoms. Most women with PCOS are able to lead a normal life without significant complications. PCOS CAUSE-The cause of polycystic ovary syndrome (PCOS) is not completely understood. It is believed that abnormal levels of the pituitary hormone luteinizing hormone (LH) and high levels of female hormones (androgens) interfere with normal function of the ovaries. To explain how these hormones cause symptoms, it is helpful to understand the normal menstrual cycle. Normal menstrual cycle-The brain (including the pituitary gland), ovaries, and uterus normally follow a sequence of events once per month; this sequence helps to prepare the body for pregnancy. Two hormones, follicle-stimulating hormone  (FSH) and LH, are made by the pituitary gland. Two other hormones, progesterone and estrogen, are made by the ovaries. During the first half of the cycle, small increases in [facility] stimulate the ovary to develop a follicle that contains an egg (oocyte). The follicle produces rising levels of estrogen, which cause the lining of the uterus to thicken and the pituitary to release a very large amount of LH. This midcycle "surge" of LH causes the egg to be released from the ovary (called ovulation) (figure 1). If the egg is fertilized by a sperm, it develops into an embryo, which travels through the fallopian tube to the uterus. After ovulation, the ovary produces both estrogen and progesterone, which prepare the uterus for possible embryo implantation and pregnancy. Menstrual cycle in PCOS-In women with polycystic ovary syndrome (PCOS), multiple small follicles (small cysts 4 to 9 mm in diameter) accumulate in the ovary, hence the term polycystic ovaries. None of these small follicles are capable of growing to a size that would trigger ovulation. As a result, the levels of estrogen, progesterone, LH, and FSH become imbalanced. Androgens are normally produced by the ovaries and the adrenal glands. Examples of androgens include testosterone, androstenedione, dehydroepiandrosterone (DHEA), and DHEA sulfate (DHEAS). Androgens may become increased in women with PCOS because of the high levels of LH but also because of high levels of  insulin that are usually seen with PCOS. (See 'Insulin abnormalities' below.) PCOS SYMPTOMS-The changes in hormone levels described above cause the classic symptoms of polycystic ovary syndrome (PCOS), including absent or irregular and infrequent menstrual periods, increased body hair growth or scalp hair loss, acne, and difficulty becoming pregnant. (See "Patient education: Hair loss in men and women (androgenetic alopecia) (Beyond the Basics)".) Signs and symptoms of PCOS usually begin  around the time of puberty, although some women do not develop symptoms until late adolescence or even into early adulthood. Because hormonal changes vary from one woman to another, patients with PCOS may have mild to severe acne, facial hair growth, or scalp hair loss. Menstrual irregularity-If ovulation does not occur, the lining of the uterus (called the endometrium) does not uniformly shed and regrow as in a normal menstrual cycle. Instead, the endometrium becomes thicker and may shed irregularly, which can result in heavy and/or prolonged bleeding. Irregular or absent menstrual periods can increase a woman's risk of endometrial overgrowth (called endometrial hyperplasia) or even endometrial cancer. Women with PCOS usually have fewer than six to eight menstrual periods per year. Some women have normal cycles during puberty, which may become irregular if the woman becomes overweight. Weight gain and obesity-PCOS is associated with gradual weight gain and obesity in approximately one-half of women. For some women with PCOS, obesity develops at the time of puberty. Hair growth and acne-Female-pattern hair growth (hirsutism) may be seen on the upper lip, chin, neck, sideburn area, chest, upper or lower abdomen, upper arm, and inner thigh. Acne is a skin condition that causes oily skin and blockages in hair follicles. (See "Patient education: Hirsutism (excess hair growth in women) (Beyond the Basics)" and "Patient education: Acne (Beyond the Basics)".) Insulin abnormalities-PCOS is associated with elevated levels of insulin in the blood. Insulin is a hormone that is produced by specialized cells within the pancreas; insulin regulates blood glucose levels. When blood glucose levels rise (after eating, for example), these cells produce insulin to help the body use glucose for energy. ?If glucose levels do not respond to normal levels of insulin, the pancreas produces more insulin. Excess production of  insulin is called hyperinsulinemia. ?When increased levels of insulin are required to maintain normal glucose levels, a person is said to be insulin resistant. ?When the blood glucose levels are not completely controlled, even with increased amounts of insulin, the person is said to have glucose intolerance (sometimes referred to as "prediabetes"). ?If blood glucose levels continue to rise despite increased insulin levels, the person is said to have type 2 diabetes. These conditions are diagnosed with blood tests. (See "Patient education: Diabetes mellitus type 2: Overview (Beyond the Basics)".) Insulin resistance and hyperinsulinemia can occur in both normal-weight and overweight women with PCOS. Among women with PCOS, up to 35 percent of those who are obese develop impaired glucose tolerance ("prediabetes") by age 49 years, while up to 10 percent of obese women develop type 2 diabetes. The risk of these conditions is much higher in women with PCOS compared with women without PCOS. A family history of diabetes, overweight and obesity, as well as race and ethnicity (particularly African American and Hispanic), can increase the likelihood of developing diabetes among women with PCOS. Infertility-Many women with PCOS do not ovulate regularly, and it may take these women longer to become pregnant. An infertility evaluation is often recommended after 6 to 12 months of trying to become pregnant. (See 'Treatment of infertility' below.) Heart disease-Women who are obese and  who also have insulin resistance or diabetes might have an increased risk of coronary artery disease, the narrowing of the arteries that supply blood to the heart. It is not known for sure if women with PCOS are at increased risk for this condition. Both weight loss and treatment of insulin abnormalities can decrease this risk. Other treatments (eg, cholesterol-lowering medications, treatments for high blood pressure) may also be recommended.  (See "Patient education: High cholesterol and lipids (hyperlipidemia) (Beyond the Basics)" and "Patient education: High blood pressure treatment in adults (Beyond the Basics)".) Sleep apnea-Sleep apnea is a condition that causes brief spells where breathing stops (apnea) during sleep. Patients with this problem often experience fatigue and daytime sleepiness. In addition, there is evidence that people with untreated sleep apnea have an increased risk of insulin resistance, obesity, diabetes, and cardiovascular problems, such as high blood pressure, heart attack, abnormal heart rhythms, or stroke. Sleep apnea may occur in up to 49 percent of women with PCOS. The condition can be diagnosed with a sleep study, and several treatments are available. (See "Patient education: Sleep apnea in adults (Beyond the Basics)".) PCOS DIAGNOSIS-There is no single test for diagnosing polycystic ovary syndrome (PCOS). You may be diagnosed with PCOS based upon your symptoms, blood tests, and a physical examination. Expert groups have determined that a woman must have two out of three of the following to be diagnosed with PCOS: ?Irregular menstrual periods caused by anovulation or irregular ovulation. ?Evidence of elevated androgen levels. The evidence can be based upon signs (excess hair growth, acne, or female-pattern balding) or blood tests (high androgen levels). ?Polycystic ovaries on pelvic ultrasound. In addition, there must be no other cause of elevated androgen levels or irregular periods (eg, congenital adrenal hyperplasia, androgen-secreting tumors, or hyperprolactinemia). Blood tests are usually recommended to determine whether another condition is the cause of your signs and/or symptoms. Blood tests for pregnancy, prolactin level, thyroid-stimulating hormone (TSH), and follicle-stimulating hormone [facility]) may be recommended. Insulin levels are not used to diagnose PCOS, partly because insulin levels are high in  people who are above normal body weight and because there is no level of insulin that is "diagnostic" for PCOS. If PCOS is confirmed, blood glucose and cholesterol testing are usually performed. An oral glucose tolerance test is the best way to diagnose prediabetes and/or diabetes. A fasting glucose level is often normal even when prediabetes or diabetes is present. Many clinicians who treat PCOS patients also recommend screening for sleep apnea with questionnaires or overnight sleep studies in a sleep laboratory. In women with moderate to severe hirsutism (excess hair growth), blood tests for testosterone and dehydroepiandrosterone sulfate (DHEAS) may be recommended. All women who are diagnosed with PCOS should be monitored by a health care provider over time. Symptoms of PCOS may seem minor and annoying, and treatment may seem unnecessary. However, untreated PCOS can increase a woman's risk of other health problems over time. PCOS TREATMENTS Oral contraceptives-Oral contraceptives (OCs; with combined estrogen and progestin) are the most commonly used treatment for regulating menstrual periods in women with polycystic ovary syndrome (PCOS). OCs protect the woman from endometrial (uterine) hyperplasia or cancer by inducing a monthly menstrual period. OCs are also effective for treating hirsutism and acne. A skin patch and vaginal ring are also available for contraception. Some women choose intrauterine devices (IUDs) containing a type of progesterone to minimize uterine bleeding and protect against uterine cancer. However, unlike OCs, patch, and ring, the IUD is not effective for treating acne or facial  hair. Women with PCOS occasionally ovulate, and OCs are useful in providing protection from pregnancy. Although an OC allows for bleeding once per month, this does not mean that the PCOS is "cured"; irregular cycles generally return when the OC is stopped. (See "Patient education: Absent or irregular periods  (Beyond the Basics)".) OCs decrease the body's production of androgens, and antiandrogen drugs (such as spironolactone) decrease the effect of androgens. These treatments can be used in combination to reduce and slow hair growth. OCs and antiandrogens can also reduce acne. Other prescription skin treatments (eg, medicated lotions) or oral antibiotics may be recommended in some cases. (See "Patient education: Acne (Beyond the Basics)".) Before prescribing an OC, a clinician will perform an examination or a blood test to be certain that a woman is not pregnant. If a woman has not had a period for six weeks or longer, the clinician may first prescribe a hormone (sample brand name: Provera) to induce a menstrual period. Side effects-Some women who take birth control pills (not just those with PCOS) stop having monthly bleeding or develop irregular spotting and bleeding. Irregular bleeding usually resolves after a few menstrual cycles. Many women worry that they will gain weight on the pill. This is not a concern with the currently available low-dose pills. Some women develop nausea, breast tenderness, and bloating after beginning the pill, but these symptoms usually resolve after two or three months. The pill is safe and effective, although it slightly increases the risk of blood clots in the legs or lungs; this is a rare complication in young, healthy women who do not smoke, but it is more of a concern in women who are obese and in older women. (See "Patient education: Hormonal methods of birth control (Beyond the Basics)".) Progestin-Another method to treat menstrual irregularity is to take a hormone called progestin (sample brand name: Provera) for 10 to 14 days every one to three months. This will induce a period in almost all women with PCOS, but it does not help with the cosmetic concerns (hirsutism and acne) and does not prevent pregnancy. It does reduce the risk of uterine cancer. Hair  treatments-Excess hair growth on the face and/or other parts of the body can be removed by shaving or use of depilatories, electrolysis, or laser therapy. Many women worry that these treatments cause hair to grow faster, although this is not true. (See "Patient education: Hirsutism (excess hair growth in women) (Beyond the Basics)".)  In women with PCOS, hormonal treatment of excess hair growth is typically approached in a two-step process. The first step is to prescribe an estrogen-progestin contraceptive (ie, a birth control pill). If, after six months of hormone treatment, sufficient improvement in excess hair growth has not been achieved, a second medication called spironolactone, an antiandrogen, is added. If hormone treatment with an estrogen-progestin results in a satisfactory reduction in excess hair growth, this therapy is continued. Scalp hair loss can be treated with medications in some situations. Other options include hair replacement and wigs. (See "Patient education: Hair loss in men and women (androgenetic alopecia) (Beyond the Basics)".) Weight loss-Weight loss is one of the most effective approaches for managing insulin abnormalities, irregular menstrual periods, and other symptoms of PCOS. For example, many overweight women with PCOS who lose 5 to 10 percent of their body weight notice that their periods become more regular. Weight loss can often be achieved with a program of diet and exercise. There are a number of options available to treat obesity. These options  are identical to those recommended for women without PCOS and include diet and exercise, weight loss medications (although their use is limited), and weight loss surgery. (See "Patient education: Weight loss treatments (Beyond the Basics)".) Weight loss surgery may be an option for severely obese women with PCOS. Women can lose significant amounts of weight after surgery, which can restore normal menstrual cycles, reduce high  androgen levels and hirsutism, and reduce the risk of type 2 diabetes. (See "Patient education: Weight loss surgery and procedures (Beyond the Basics)".) Metformin-Metformin (sample brand name: Glucophage) is medication that improves the effectiveness of insulin produced by the body. It was developed as a treatment for type 2 diabetes but may be recommended for women with PCOS in selected situations. ?If a woman does not have regular menstrual cycles, the first-line treatment is a hormonal method of birth control, such as birth control pills. If the woman cannot take birth control pills, one alternative is to take metformin; a progestin is usually recommended, in addition to metformin, for six months or until menstrual cycles are regular. (See 'Progestin' above.) ?Metformin may help with weight loss. Although metformin is not a weight-loss drug, some studies have shown that women with PCOS who are on a low-calorie diet lose slightly more weight when metformin is added. If metformin is used, it is essential that diet and exercise are also part of the recommended regimen because the weight that is lost in the early phase of metformin treatment may be regained over time. Metformin is not usually recommended for women with PCOS who have difficulty becoming pregnant, because it is not as effective as an alternative treatment for ovulation induction, clomiphene. (See 'Treatment of infertility' below.) An expert group does not recommend metformin for women with PCOS who have excessive hair growth (hirsutism). Birth control pills alone, or in combination with an antiandrogen medication, are a better option. (See "Patient education: Hirsutism (excess hair growth in women) (Beyond the Basics)".) Treatment of infertility-If tests determine that lack of ovulation is the cause of infertility, several treatment options are available. These treatments work best in women who are not obese. The primary treatment for women  who are unable to become pregnant and who have PCOS is weight loss. Even a modest amount of weight loss may allow the woman to begin ovulating normally. In addition, weight loss can improve the effectiveness of other infertility treatments. (See "Patient education: Evaluation of the infertile couple (Beyond the Basics)".) Clomiphene is a [place] Food and Drug Administration (FDA)-approved oral medication that stimulates the ovaries to release one or more eggs. It triggers ovulation in approximately 30 percent of women with PCOS, and approximately 50 percent of these women will become pregnant. (See "Patient education: Ovulation induction with clomiphene (Beyond the Basics)".) Letrozole is medication that is FDA approved for the treatment of breast cancer, but it is not approved for induction of ovulation. However, some studies have shown that live birth rates are higher in obese women with PCOS when they are treated with letrozole rather than clomiphene. A few studies have shown that taking metformin in addition to clomiphene increases the rate of ovulation; other studies have shown no additional benefit of adding metformin to clomiphene treatment [1]. In addition, it is not clear if metformin is safe during pregnancy (but metformin is FDA category B in pregnancy, which is generally interpreted as reasonably safe); women who take metformin before pregnancy are usually advised to stop it once they become pregnant. If a woman does not ovulate or  is unable to conceive with clomiphene, gonadotropin therapy (follicle-stimulating hormone [FSH] injections) may be recommended. Ovulation occurs in almost all women with PCOS who use gonadotropin therapy; approximately 60 percent of these women become pregnant. (See "Patient education: Infertility treatment with gonadotropins (Beyond the Basics)".) WHERE TO GET MORE INFORMATION-Your health care provider is the best source of information for questions and concerns related to  your medical problem. This article will be updated as needed on our website ([email or web address]). Related topics for patients, as well as selected articles written for health care professionals, are also available. Some of the most relevant are listed below. Patient level information-[employer] offers two types of patient education materials. The Basics-The Basics patient education pieces answer the four or five key questions a patient might have about a given condition. These articles are best for patients who want a general overview and who prefer short, easy-to-read materials. Patient education: Polycystic ovary syndrome (The Basics) Patient education: Hirsutism (excess hair growth in women) (The Basics) Patient education: Ovarian cysts (The Basics) Patient education: Absent or irregular periods (The Basics) Beyond the [facility] the Basics patient education pieces are longer, more sophisticated, and more detailed. These articles are best for patients who want in-depth information and are comfortable with some medical jargon. Patient education: Hair loss in men and women (androgenetic alopecia) (Beyond the Basics) Patient education: Hirsutism (excess hair growth in women) (Beyond the Basics) Patient education: Acne (Beyond the Basics) Patient education: Diabetes mellitus type 2: Overview (Beyond the Basics) Patient education: High cholesterol and lipids (hyperlipidemia) (Beyond the Basics) Patient education: High blood pressure treatment in adults (Beyond the Basics) Patient education: Sleep apnea in adults (Beyond the Basics) Patient education: Absent or irregular periods (Beyond the Basics) Patient education: Hormonal methods of birth control (Beyond the Basics) Patient education: Weight loss treatments (Beyond the Basics) Patient education: Weight loss surgery and procedures (Beyond the Basics) Patient education: Evaluation of the infertile couple (Beyond the Basics) Patient  education: Ovulation induction with clomiphene (Beyond the Basics) Patient education: Infertility treatment with gonadotropins (Beyond the Basics)  Professional level information-Professional level articles are designed to keep doctors and other health professionals up-to-date on the latest medical findings. These articles are thorough, long, and complex, and they contain multiple references to the research on which they are based. Professional level articles are best for people who are comfortable with a lot of medical terminology and who want to read the same materials their doctors are reading. Definition, clinical features and differential diagnosis of polycystic ovary syndrome in adolescents Clinical manifestations of polycystic ovary syndrome in adults Etiology and pathophysiology of polycystic ovary syndrome in adolescents Diagnosis of polycystic ovary syndrome in adults Epidemiology and genetics of the polycystic ovary syndrome in adults Metformin for treatment of the polycystic ovary syndrome Treatment of hirsutism Treatment of polycystic ovary syndrome in adolescents Treatment of polycystic ovary syndrome in adults  The following organizations also provide reliable health information. ?[employer] (TourneyLocator.at.html) ?[place] (www.hormone.org/diseases-and-conditions/womens-health/polycystic-ovary-syndrome) ?[place] Department of Health and Human Services (AbsolutelyGenuine.com.br) Use of UpToDate is subject to the Subscription and License Agreement. REFERENCES 1. Melrose Nakayama, Barnhart HX, Schlaff WD, et al. Clomiphene, metformin, or both for infertility in the polycystic ovary syndrome. Alison Stalling J Med 2007; 356:551.  Topic 2163 Version 13.0

## 2016-01-25 NOTE — Progress Notes (Signed)
BP 114/78 (BP Location: Left Arm, Patient Position: Sitting, Cuff Size: Large)   Pulse 94   Temp 98.4 F (36.9 C)   Ht 5' 2.8" (1.595 m)   Wt 203 lb (92.1 kg)   LMP 01/18/2016 Comment: neg preg test  SpO2 98%   BMI 36.19 kg/m    Subjective:    Patient ID: Jasmine Tran, female    DOB: [birth year], 35 y.o.   MRN: [id number]  HPI: Jasmine Tran is a 35 y.o. female  Chief Complaint  Patient presents with  . ADHD   ADHD FOLLOW UP ADHD status: controlled Satisfied with current therapy: no Medication compliance:  excellent compliance Controlled substance contract: yes Previous psychiatry evaluation: yes Previous medications: yes    Taking meds on weekends/vacations: occasionally Work/school performance:  excellent Difficulty sustaining attention/completing tasks: no Distracted by extraneous stimuli: no Does not listen when spoken to: no  Fidgets with hands or feet: no Unable to stay in seat: no Blurts out/interrupts others: no ADHD Medication Side Effects: yes    Decreased appetite: no    Headache: no    Sleeping disturbance pattern: no    Irritability: no    Rebound effects (worse than baseline) off medication: no    Anxiousness: no    Dizziness: no    Tics: no     Chest pain: yes  ER FOLLOW UP Time since discharge: 4 days Hospital/facility: [facility] Diagnosis: Chest pain Procedures/tests: CT angio of the chest- normal exam, incidental 0.5cm nodule in R middle lobe, no follow up needed. Negative labs Consultants: None New medications: None (no better with toradol, valium or morphine) Discharge instructions:  Follow up with cardiology Status: better  Relevant past medical, surgical, family and social history reviewed and updated as indicated. Interim medical history since our last visit reviewed. Allergies and medications reviewed and updated.  Review of Systems  Constitutional: Negative.   HENT: Positive for congestion, postnasal drip, rhinorrhea, sinus pressure  and sore throat. Negative for dental problem, drooling, ear discharge, ear pain, facial swelling, hearing loss, mouth sores, nosebleeds, sneezing, tinnitus, trouble swallowing and voice change.   Respiratory: Negative.   Cardiovascular: Negative.   Skin: Positive for rash. Negative for color change, pallor and wound.  Psychiatric/Behavioral: Negative.     Per HPI unless specifically indicated above     Objective:    BP 114/78 (BP Location: Left Arm, Patient Position: Sitting, Cuff Size: Large)   Pulse 94   Temp 98.4 F (36.9 C)   Ht 5' 2.8" (1.595 m)   Wt 203 lb (92.1 kg)   LMP 01/18/2016 Comment: neg preg test  SpO2 98%   BMI 36.19 kg/m   Wt Readings from Last 3 Encounters:  01/25/16 203 lb (92.1 kg)  01/21/16 200 lb (90.7 kg)  12/29/15 199 lb (90.3 kg)    Physical Exam  Constitutional: She is oriented to person, place, and time. She appears well-developed and well-nourished. No distress.  HENT:  Head: Normocephalic and atraumatic.  Right Ear: Hearing normal.  Left Ear: Hearing normal.  Nose: Nose normal.  Eyes: Conjunctivae and lids are normal. Right eye exhibits no discharge. Left eye exhibits no discharge. No scleral icterus.  Cardiovascular: Normal rate, normal heart sounds and intact distal pulses.  Exam reveals no gallop and no friction rub.   No murmur heard. Pulmonary/Chest: Effort normal and breath sounds normal. No respiratory distress. She has no wheezes. She has no rales. She exhibits no tenderness.  Musculoskeletal: Normal range of motion.  Neurological:  She is alert and oriented to person, place, and time.  Skin: Skin is warm, dry and intact. No rash noted. She is not diaphoretic. No erythema. No pallor.  Psychiatric: She has a normal mood and affect. Her speech is normal and behavior is normal. Judgment and thought content normal. Cognition and memory are normal.  Nursing note and vitals reviewed.   Results for orders placed or performed during the  hospital encounter of [id number]  Basic metabolic panel  Result Value Ref Range   Sodium 136 135 - 145 mmol/L   Potassium 3.6 3.5 - 5.1 mmol/L   Chloride 107 101 - 111 mmol/L   CO2 20 (L) 22 - 32 mmol/L   Glucose, Bld 88 65 - 99 mg/dL   BUN 8 6 - 20 mg/dL   Creatinine, Ser 0.67 0.44 - 1.00 mg/dL   Calcium 9.2 8.9 - 10.3 mg/dL   GFR calc non Af Amer >60 >60 mL/min   GFR calc Af Amer >60 >60 mL/min   Anion gap 9 5 - 15  CBC  Result Value Ref Range   WBC 6.8 3.6 - 11.0 K/uL   RBC 4.58 3.80 - 5.20 MIL/uL   Hemoglobin 14.1 12.0 - 16.0 g/dL   HCT 41.4 35.0 - 47.0 %   MCV 90.5 80.0 - 100.0 fL   MCH 30.8 26.0 - 34.0 pg   MCHC 34.0 32.0 - 36.0 g/dL   RDW 13.4 11.5 - 14.5 %   Platelets 257 150 - 440 K/uL  Troponin I  Result Value Ref Range   Troponin I <0.03 <0.03 ng/mL  Troponin I  Result Value Ref Range   Troponin I <0.03 <0.03 ng/mL  POC Urine Pregnancy, ED  Result Value Ref Range   Preg Test, Ur Negative Negative      Assessment & Plan:   Problem List Items Addressed This Visit      Endocrine   PCOS (polycystic ovarian syndrome)    Discussed medications to help her lose weight and control PCOS, she will review and let us know what she wants to do.        Other   ADD (attention deficit disorder) - Primary    Would like to go down to the 50mg  of the vyvanse and check back by phone in about 3-4 weeks.        Other Visit Diagnoses    Chest pain, unspecified chest pain type       Given strong family history of heart disease, will get her into see cardiology. Referral generated today.   Relevant Orders   Ambulatory referral to Cardiology       Follow up plan: Return in about 3 months (around 04/25/2016) for Physical.

## 2016-01-25 NOTE — Assessment & Plan Note (Signed)
Would like to go down to the 50mg  of the vyvanse and check back by phone in about 3-4 weeks.

## 2016-01-29 ENCOUNTER — Ambulatory Visit (INDEPENDENT_AMBULATORY_CARE_PROVIDER_SITE_OTHER): Payer: BLUE CROSS/BLUE SHIELD | Admitting: Gastroenterology

## 2016-01-29 ENCOUNTER — Encounter: Payer: Self-pay | Admitting: Gastroenterology

## 2016-01-29 VITALS — BP 108/72 | HR 91 | Temp 98.0°F | Ht 62.8 in | Wt 202.4 lb

## 2016-01-29 DIAGNOSIS — R112 Nausea with vomiting, unspecified: Secondary | ICD-10-CM

## 2016-01-29 DIAGNOSIS — R110 Nausea: Secondary | ICD-10-CM

## 2016-01-29 DIAGNOSIS — R197 Diarrhea, unspecified: Secondary | ICD-10-CM

## 2016-01-29 MED ORDER — PANTOPRAZOLE SODIUM 40 MG PO TBEC: 40.0000 mg | DELAYED_RELEASE_TABLET | Freq: Every day | ORAL | 11 refills | Status: DC

## 2016-01-29 NOTE — Progress Notes (Signed)
Gastroenterology Consultation  Referring Provider:     Valerie Roys, DO Primary Care Physician:  Park Liter, DO Primary Gastroenterologist:  Dr. Allen Norris     Reason for Consultation:     Nausea        HPI:   Jasmine Tran is a 35 y.o. y/o female referred for consultation & management of Nausea by Dr. Park Liter, DO.  This patient comes here today for establishing care.  The patient reports that she went to a gastrologist in [place] and was reporting some nausea when she brushed her teeth.  The patient started her on a PPI which she reports did not help. The patient then went to the ER and was found to have hepatic lesions with the diagnosis of hepatic adenomas.  The patient then had a liver resection at [facility].  She reports that she was doing well from a nausea point of view until recently.  The patient has since had a CT scan of the chest and a CT scan of the abdomen without any further lesions seen.  The patient reports that her symptoms are only present in the morning.  She also states that she has some numbness over the area of the abdomen where she had the surgery. The patient is now concerned because of her symptoms returning and she associates this with her findings of her adenomas in the past. The patient also reports that she is been having diarrhea which is new for her.   She states that she has been increasing dairy products in her diet for health reasons.  Past Medical History:  Diagnosis Date  . ADD (attention deficit disorder)   . Bipolar 1 disorder with moderate mania ([place])     Past Surgical History:  Procedure Laterality Date  . APPENDECTOMY    . CHOLECYSTECTOMY    . TUMOR REMOVAL     Liver, benign    Prior to Admission medications   Medication Sig Start Date End Date Taking? Authorizing Provider  EPINEPHrine (EPIPEN 2-PAK) 0.3 mg/0.3 mL IJ SOAJ injection Inject 0.3 mLs (0.3 mg total) into the muscle once. 10/23/15  Yes Megan P Johnson, DO    L-Methylfolate-Algae (DEPLIN 15) 15-90.314 MG CAPS TAKE 1 CAPSULE BY MOUTH EVERY DAY 12/18/14  Yes Historical Provider, MD  lamoTRIgine (LAMICTAL) 200 MG tablet TAKE 1 TABLET DAILY 10/08/15  Yes Megan P Johnson, DO  lisdexamfetamine (VYVANSE) 50 MG capsule Take 1 capsule (50 mg total) by mouth daily. 01/25/16  Yes Megan P Johnson, DO  oxcarbazepine (TRILEPTAL) 600 MG tablet TAKE 1 TABLET TWICE A DAY 10/08/15  Yes Megan P Johnson, DO  fluconazole (DIFLUCAN) 150 MG tablet 1 tab once, may repeat in 1 day if not better Patient not taking: Reported on 01/29/2016 11/16/15   Megan P Johnson, DO  pantoprazole (PROTONIX) 40 MG tablet Take 1 tablet (40 mg total) by mouth daily. 01/29/16   Lucilla Lame, MD    Family History  Problem Relation Age of Onset  . Heart disease Mother   . Heart disease Father   . Cancer Neg Hx   . COPD Neg Hx   . Diabetes Neg Hx   . Hypertension Neg Hx   . Stroke Neg Hx      Social History  Substance Use Topics  . Smoking status: Former Smoker    Packs/day: 0.25    Types: Cigarettes  . Smokeless tobacco: Never Used  . Alcohol use No    Allergies as of 01/29/2016 -  Review Complete 01/29/2016  Allergen Reaction Noted  . Tape Dermatitis 04/20/2015  . Doxycycline  03/30/2015  . Morphine and related Hives 03/19/2015  . Phenergan [promethazine hcl] Nausea And Vomiting 03/19/2015  . Sertraline  03/30/2015  . Prednisone Nausea And Vomiting 06/26/2015    Review of Systems:    All systems reviewed and negative except where noted in HPI.   Physical Exam:  BP 108/72   Pulse 91   Temp 98 F (36.7 C) (Oral)   Ht 5' 2.8" (1.595 m)   Wt 202 lb 6.4 oz (91.8 kg)   LMP 01/18/2016 Comment: neg preg test  BMI 36.08 kg/m  Patient's last menstrual period was 01/18/2016. Psych:  Alert and cooperative. Normal mood and affect. General:   Alert,  Well-developed, well-nourished, pleasant and cooperative in NAD Head:  Normocephalic and atraumatic. Eyes:  Sclera clear, no icterus.    Conjunctiva pink. Ears:  Normal auditory acuity. Nose:  No deformity, discharge, or lesions. Mouth:  No deformity or lesions,oropharynx pink & moist. Neck:  Supple; no masses or thyromegaly. Lungs:  Respirations even and unlabored.  Clear throughout to auscultation.   No wheezes, crackles, or rhonchi. No acute distress. Heart:  Regular rate and rhythm; no murmurs, clicks, rubs, or gallops. Abdomen:  Normal bowel sounds.  No bruits.  Soft, Tenderness in the epigastric area and non-distended without masses, hepatosplenomegaly or hernias noted.  No guarding or rebound tenderness.  Negative Carnett sign.   Rectal:  Deferred.  Msk:  Symmetrical without gross deformities.  Good, equal movement & strength bilaterally. Pulses:  Normal pulses noted. Extremities:  No clubbing or edema.  No cyanosis. Neurologic:  Alert and oriented x3;  grossly normal neurologically. Skin:  Intact without significant lesions or rashes.  No jaundice. Lymph Nodes:  No significant cervical adenopathy. Psych:  Alert and cooperative. Normal mood and affect.  Imaging Studies: Dg Chest 2 View  Result Date: 01/21/2016 CLINICAL DATA:  Pt states chest pain that began 45 minutes ago, left sided pain, states tightness in her jaw and arm, pt clenching chest and breathing fast during triage, left arm tingling, and states it feels like her heart is fluttering. EXAM: CHEST  2 VIEW COMPARISON:  04/19/2015 FINDINGS: The heart size and mediastinal contours are within normal limits. Both lungs are clear. The visualized skeletal structures are unremarkable. IMPRESSION: No active cardiopulmonary disease. Electronically Signed   By: Nolon Nations M.D.   On: 01/21/2016 18:42   Ct Angio Chest Aorta W And/or Wo Contrast  Result Date: 01/21/2016 CLINICAL DATA:  Left-sided chest pain with tightness in the jaw and left arm. Question aortic dissection. EXAM: CT ANGIOGRAPHY CHEST WITH CONTRAST TECHNIQUE: Multidetector CT imaging of the chest was  performed using the standard protocol during bolus administration of intravenous contrast. Multiplanar CT image reconstructions and MIPs were obtained to evaluate the vascular anatomy. CONTRAST:  75 cc Isovue 370. COMPARISON:  PA and lateral chest this same day. FINDINGS: There is no aortic dissection or aneurysm. The patient has a normal three-vessel arch configuration. The pulmonary arteries are not sufficiently opacified to evaluate for embolus. No axillary, hilar or mediastinal lymphadenopathy. No pleural or pericardial effusion. Mild atelectasis or scar is seen in the right middle lobe. A just superior to the minor fissure on the right, there is a 0.5 x 0.5 cm nodule on image 28 of series 7. The lungs are otherwise unremarkable. Incidentally imaged upper abdomen demonstrates postoperative change of cholecystectomy. No lytic or sclerotic bony lesion is identified.  Schmorl's nodes in the thoracic spine are noted. Review of the MIP images confirms the above findings. IMPRESSION: Negative for aortic dissection or aneurysm.  No acute abnormality. 0.5 cm ground-glass attenuating nodule in the right middle lobe. No follow-up recommended. This recommendation follows the consensus statement: Guidelines for Management of Incidental Pulmonary Nodules Detected on CT Images:From the Fleischner Society 2017; published online before print (10.1148/radiol.SG:[id number]). Electronically Signed   By: Inge Rise M.D.   On: 01/21/2016 20:02    Assessment and Plan:   Jasmine Tran is a 35 y.o. y/o female who has a history of hepatic adenomas with resection.  The patient now reports that she has symptoms of epigastric discomfort and some reports of diarrhea.  The patient will be started on a PPI to see if her nausea goes away. She will also avoid milk products for one week to see if her diarrhea improves. If her symptoms do not improve the patient may need a upper endoscopy versus a colonoscopy.  The patient has been  explained the plan and agrees with it   Note: This dictation was prepared with Dragon dictation along with smaller phrase technology. Any transcriptional errors that result from this process are unintentional.

## 2016-02-12 ENCOUNTER — Encounter: Payer: Self-pay | Admitting: *Deleted

## 2016-02-18 ENCOUNTER — Other Ambulatory Visit: Payer: Self-pay

## 2016-02-18 ENCOUNTER — Telehealth: Payer: Self-pay

## 2016-02-18 MED ORDER — PANTOPRAZOLE SODIUM 40 MG PO TBEC: 40.0000 mg | DELAYED_RELEASE_TABLET | Freq: Every day | ORAL | 3 refills | Status: DC

## 2016-02-18 MED ORDER — TRIAMCINOLONE ACETONIDE 0.5 % EX OINT
1.0000 | TOPICAL_OINTMENT | Freq: Two times a day (BID) | CUTANEOUS | 0 refills | Status: DC
Start: 2016-02-18 — End: 2016-04-02

## 2016-02-18 NOTE — Telephone Encounter (Signed)
Rx sent to her pharmacy 

## 2016-02-18 NOTE — Telephone Encounter (Signed)
Triamcinolone acetonid ointment 15G 0.5%

## 2016-03-07 ENCOUNTER — Encounter: Payer: Self-pay | Admitting: Family Medicine

## 2016-03-07 ENCOUNTER — Telehealth: Payer: Self-pay | Admitting: Family Medicine

## 2016-03-07 MED ORDER — LISDEXAMFETAMINE DIMESYLATE 50 MG PO CAPS: 50.0000 mg | ORAL_CAPSULE | Freq: Every day | ORAL | 0 refills | Status: DC

## 2016-03-07 NOTE — Telephone Encounter (Signed)
Please let her know that an Rx is up front for her if she is feeling good on the 50mg 

## 2016-03-07 NOTE — Telephone Encounter (Signed)
Left message asking patient how she is doing on the 50mg , will wait for return call.

## 2016-03-10 ENCOUNTER — Encounter: Payer: Self-pay | Admitting: Family Medicine

## 2016-03-10 NOTE — Telephone Encounter (Signed)
Called, no answer, left a message for patient to return my call.

## 2016-03-11 ENCOUNTER — Encounter: Payer: Self-pay | Admitting: Family Medicine

## 2016-03-11 NOTE — Telephone Encounter (Signed)
Called patient not answer  Dr.Johnson, what would you like to do about this patient.

## 2016-03-11 NOTE — Telephone Encounter (Signed)
I've sent her a message on mychart. I will send her another one.

## 2016-03-31 NOTE — Progress Notes (Signed)
New Outpatient Visit Date: 04/02/2016  Referring Provider: Valerie Roys, DO [place], [place] [address]  Chief Complaint: Chest pain  HPI:  Jasmine Tran is a 35 y.o. year-old female with history of attention-deficit disorder, bipolar disorder, and polycystic ovarian syndrome, who has been referred by Dr. Wynetta Emery for evaluation of chest pain.  She presented to the ED on 01/21/16 with acute left-sided chest pain radiating to the left arm.  Workup, including EKG, Tn x 2, and CTA chest were unrevealing. However, she has been having episodes off and on since 07/2015. She notes a "constricting" sensation that comes and goes, with occasional radiation to the left arm. The last anywhere from a few hours to a few days and often occurs when she is lying in bed or resting. It is not related to exertion. There are no clear exacerbating or alleviating factors, with a maximum intensity of 7-8/10. Her most recent episode was in September, with the episodes typically happening about once a month. It is often accompanied with diaphoresis. She denies shortness of breath, orthopnea, PND, and edema. The patient experienced palpitations while taking Adderall and Vyvanse earlier in the year, though this resolved after Adderall was discontinued. She notes that she also stopped taking Vyvanse a little over a month ago; it should be noted that she hasn't had any further episodes of chest pain since that time.  --------------------------------------------------------------------------------------------------  Cardiovascular History & Procedures: Cardiovascular Problems:  Chest pain  Risk Factors:  Family history and tobacco use  Cath/PCI:  None  CV Surgery:  None  EP Procedures and Devices:  None  Non-Invasive Evaluation(s):  None  Recent CV Pertinent Labs: Lab Results  Component Value Date   CHOL 190 05/15/2015   TRIG 131 05/15/2015   K 3.6 01/21/2016   BUN 8 01/21/2016   BUN 8 09/11/2015   CREATININE 0.67 01/21/2016    --------------------------------------------------------------------------------------------------  Past Medical History:  Diagnosis Date  . ADD (attention deficit disorder)   . Bipolar 1 disorder with moderate mania ([place])     Past Surgical History:  Procedure Laterality Date  . APPENDECTOMY    . CHOLECYSTECTOMY    . OPEN HEPATECTOMY   06/04/2015   Liver adenoma removal  . TUMOR REMOVAL     Liver, benign    Outpatient Encounter Prescriptions as of 04/02/2016  Medication Sig  . EPINEPHrine (EPIPEN 2-PAK) 0.3 mg/0.3 mL IJ SOAJ injection Inject 0.3 mLs (0.3 mg total) into the muscle once.  Marland Kitchen L-Methylfolate-Algae (DEPLIN 15) 15-90.314 MG CAPS TAKE 1 CAPSULE BY MOUTH EVERY DAY  . lamoTRIgine (LAMICTAL) 200 MG tablet TAKE 1 TABLET DAILY  . lisdexamfetamine (VYVANSE) 50 MG capsule Take 1 capsule (50 mg total) by mouth daily.  Marland Kitchen oxcarbazepine (TRILEPTAL) 600 MG tablet TAKE 1 TABLET TWICE A DAY  . pantoprazole (PROTONIX) 40 MG tablet Take 1 tablet (40 mg total) by mouth daily. (Patient taking differently: Take 40 mg by mouth daily as needed. )  . famotidine (PEPCID) 20 MG tablet Take 1 tablet (20 mg total) by mouth 2 (two) times daily.  . [DISCONTINUED] fluconazole (DIFLUCAN) 150 MG tablet 1 tab once, may repeat in 1 day if not better (Patient not taking: Reported on 04/02/2016)  . [DISCONTINUED] triamcinolone ointment (KENALOG) 0.5 % Apply 1 application topically 2 (two) times daily. (Patient not taking: Reported on 04/02/2016)   No facility-administered encounter medications on file as of 04/02/2016.     Allergies: Tape; Doxycycline; Morphine and related; Phenergan [promethazine hcl]; Sertraline;  and Prednisone  Social History   Social History  . Marital status: Married    Spouse name: N/A  . Number of children: N/A  . Years of education: N/A   Occupational History  . Not on file.   Social History Main Topics  . Smoking status: Former Smoker     Packs/day: 0.25    Years: 5.00    Types: Cigarettes    Quit date: 06/04/2015  . Smokeless tobacco: Never Used  . Alcohol use No  . Drug use: No  . Sexual activity: Yes    Birth control/ protection: None   Other Topics Concern  . Not on file   Social History Narrative  . No narrative on file    Family History  Problem Relation Age of Onset  . Heart disease Mother   . Heart disease Father   . Hypertension Brother   . Cancer Neg Hx   . COPD Neg Hx   . Diabetes Neg Hx   . Stroke Neg Hx     Review of Systems: A 12-system review of systems was performed and was negative except as noted in the HPI.  --------------------------------------------------------------------------------------------------  Physical Exam: BP 130/78 (BP Location: Right Arm, Patient Position: Sitting, Cuff Size: Normal)   Pulse 75   Ht 5\' 2"  (1.575 m)   Wt 211 lb 12 oz (96 kg)   LMP 03/12/2016 (Approximate)   BMI 38.73 kg/m   General:  Obese woman, seated comfortably in the exam room. HEENT: No conjunctival pallor or scleral icterus.  Moist mucous membranes.  OP clear. Neck: Supple without lymphadenopathy, thyromegaly, JVD, or HJR.  No carotid bruit. Lungs: Normal work of breathing.  Clear to auscultation bilaterally without wheezes or crackles. Heart: Regular rate and rhythm without murmurs, rubs, or gallops.  Non-displaced PMI. Abd: Bowel sounds present.  Soft with mild right upper quadrant tenderness Unable to assess hepatosplenomegaly due to body habitus.. Ext: No lower extremity edema.  Radial, PT, and DP pulses are 2+ bilaterally Skin: warm and dry without rash Neuro: CNIII-XII intact.  Strength and fine-touch sensation intact in upper and lower extremities bilaterally. Psych: Normal mood and affect.  EKG:  Normal sinus rhythm without significant abnormalities.  CTA chest (01/21/16): I have personally reviewed the images. No evidence of aortic dissection or aneurysm.  No significant coronary  artery calcification.  Heart size is grossly normal.  5 mm nodule in the right lung along the minor fissure.  Cholecystectomy clips.  Lab Results  Component Value Date   WBC 6.8 01/21/2016   HGB 14.1 01/21/2016   HCT 41.4 01/21/2016   MCV 90.5 01/21/2016   PLT 257 01/21/2016    Lab Results  Component Value Date   NA 136 01/21/2016   K 3.6 01/21/2016   CL 107 01/21/2016   CO2 20 (L) 01/21/2016   BUN 8 01/21/2016   CREATININE 0.67 01/21/2016   GLUCOSE 88 01/21/2016   ALT 29 12/29/2015    Lab Results  Component Value Date   CHOL 190 05/15/2015   TRIG 131 05/15/2015    --------------------------------------------------------------------------------------------------  ASSESSMENT AND PLAN: Atypical chest pain The patient's chest pain is quite atypical.  Interestingly, it has not recurred since she stopped taking Adderall and Vyvanse.  Though the patient has a family history of coronary artery disease, it is unlikely the obstructive coronary artery disease is the cause of her pain given the character of her chest pain and young age.  We discussed the rationale for further testing,  including exercise tolerance test.  Given that she has not had any further episodes since stopping Vyvanse almost 2 months ago, we have agreed to defer further testing at this time.  The patient will follow-up with her PCP for further risk stratification, including lipid and diabetes screening.  Follow-up: Return to clinic in 3 months.  Nelva Bush, MD 04/02/2016 9:21 PM

## 2016-04-02 ENCOUNTER — Encounter: Payer: Self-pay | Admitting: Internal Medicine

## 2016-04-02 ENCOUNTER — Encounter: Payer: Self-pay | Admitting: Family Medicine

## 2016-04-02 ENCOUNTER — Ambulatory Visit (INDEPENDENT_AMBULATORY_CARE_PROVIDER_SITE_OTHER): Payer: BLUE CROSS/BLUE SHIELD | Admitting: Family Medicine

## 2016-04-02 ENCOUNTER — Ambulatory Visit (INDEPENDENT_AMBULATORY_CARE_PROVIDER_SITE_OTHER): Payer: BLUE CROSS/BLUE SHIELD | Admitting: Internal Medicine

## 2016-04-02 VITALS — BP 130/78 | HR 75 | Ht 62.0 in | Wt 211.8 lb

## 2016-04-02 VITALS — BP 115/80 | HR 78 | Temp 97.5°F | Wt 210.0 lb

## 2016-04-02 DIAGNOSIS — R0789 Other chest pain: Secondary | ICD-10-CM

## 2016-04-02 DIAGNOSIS — J0101 Acute recurrent maxillary sinusitis: Secondary | ICD-10-CM | POA: Diagnosis not present

## 2016-04-02 DIAGNOSIS — J0191 Acute recurrent sinusitis, unspecified: Secondary | ICD-10-CM | POA: Diagnosis not present

## 2016-04-02 MED ORDER — TRIAMCINOLONE ACETONIDE 40 MG/ML IJ SUSP
40.0000 mg | Freq: Once | INTRAMUSCULAR | Status: AC
  Administered 2016-04-02: 40 mg via INTRAMUSCULAR

## 2016-04-02 MED ORDER — AMOXICILLIN-POT CLAVULANATE 875-125 MG PO TABS: 1.0000 | ORAL_TABLET | Freq: Two times a day (BID) | ORAL | 0 refills | Status: DC

## 2016-04-02 MED ORDER — ONDANSETRON 4 MG PO TBDP: 4.0000 mg | ORAL_TABLET | Freq: Three times a day (TID) | ORAL | 0 refills | Status: DC | PRN

## 2016-04-02 MED ORDER — FAMOTIDINE 20 MG PO TABS: 20.0000 mg | ORAL_TABLET | Freq: Two times a day (BID) | ORAL | 3 refills | Status: DC

## 2016-04-02 NOTE — Patient Instructions (Signed)
Follow up as needed

## 2016-04-02 NOTE — Progress Notes (Signed)
BP 115/80   Pulse 78   Temp 97.5 F (36.4 C)   Wt 210 lb (95.3 kg)   LMP 03/12/2016 (Approximate)   SpO2 99%   BMI 38.41 kg/m    Subjective:    Patient ID: Jasmine Tran, female    DOB: [birth year], 35 y.o.   MRN: [id number]  HPI: Jasmine Tran is a 35 y.o. female  Chief Complaint  Patient presents with  . Sinusitis    off and on for "months". Ears hurt, sinus/head congestion, runny nose, sinus drainage, sore throat. No fever, no cough   Patient presents with recurrent sinus issues that are being followed by ENT. ENT has recommended sinus surgery as sxs have been ongoing since March of this year. Has been on several rounds of abx over the course of the year with minimal relief. Not ready at this time for surgery, wants to try one more round of treatment. Ear pain, sinus congestion/pain/pressure, rhinorrhea, sore throat. Denies cough, SOB, fever, CP. Not taking anything OTC.   Relevant past medical, surgical, family and social history reviewed and updated as indicated. Interim medical history since our last visit reviewed. Allergies and medications reviewed and updated.  Review of Systems  Constitutional: Negative.   HENT: Positive for congestion, ear pain, postnasal drip, rhinorrhea, sinus pain, sinus pressure and sore throat.   Eyes: Negative.   Respiratory: Negative.   Cardiovascular: Negative.   Gastrointestinal: Negative.   Genitourinary: Negative.   Musculoskeletal: Negative.   Skin: Negative.   Neurological: Positive for headaches.  Psychiatric/Behavioral: Negative.     Per HPI unless specifically indicated above     Objective:    BP 115/80   Pulse 78   Temp 97.5 F (36.4 C)   Wt 210 lb (95.3 kg)   LMP 03/12/2016 (Approximate)   SpO2 99%   BMI 38.41 kg/m   Wt Readings from Last 3 Encounters:  04/02/16 210 lb (95.3 kg)  04/02/16 211 lb 12 oz (96 kg)  01/29/16 202 lb 6.4 oz (91.8 kg)    Physical Exam  Constitutional: She is oriented to person, place,  and time. She appears well-developed and well-nourished. No distress.  HENT:  Head: Atraumatic.  Right Ear: External ear normal.  Left Ear: External ear normal.  Mouth/Throat: No oropharyngeal exudate.  Nasal mucosa boggy b/l B/l maxillary sinus TTP  Eyes: Conjunctivae are normal. Pupils are equal, round, and reactive to light. No scleral icterus.  Neck: Normal range of motion. Neck supple.  Cardiovascular: Normal rate.   Pulmonary/Chest: Effort normal and breath sounds normal. No respiratory distress.  Musculoskeletal: Normal range of motion.  Lymphadenopathy:    She has no cervical adenopathy.  Neurological: She is alert and oriented to person, place, and time.  Skin: Skin is warm and dry.  Psychiatric: She has a normal mood and affect. Her behavior is normal.  Nursing note and vitals reviewed.     Assessment & Plan:   Problem List Items Addressed This Visit    None    Visit Diagnoses    Acute recurrent maxillary sinusitis    -  Primary   IM kenalog given today, augmentin sent. Pt will return to ENT when ready to go through with sinus surgery.    Relevant Medications   amoxicillin-clavulanate (AUGMENTIN) 875-125 MG tablet   triamcinolone acetonide (KENALOG-40) injection 40 mg (Completed)    Zofran sent in case N/V with kenalog injection (historically gets N/V with steroids)  Follow up plan: Return if symptoms worsen or  fail to improve.

## 2016-04-02 NOTE — Patient Instructions (Signed)
Medication Instructions:  Your physician has recommended you make the following change in your medication:  1- START Famotidine 20mg  (1 tablet) by mouth twice a day.   Labwork: none  Testing/Procedures: none  Follow-Up: Your physician recommends that you schedule a follow-up appointment in: 3 MONTHS WITH DR END.   If you need a refill on your cardiac medications before your next appointment, please call your pharmacy.

## 2016-04-03 ENCOUNTER — Telehealth: Payer: Self-pay | Admitting: Family Medicine

## 2016-04-03 NOTE — Telephone Encounter (Signed)
Routing to provider  

## 2016-04-03 NOTE — Telephone Encounter (Signed)
Pt stated that she got an injection yesterday and has had a fever of 102 and would like to know if this could be some type of reaction to the shot.

## 2016-04-03 NOTE — Telephone Encounter (Signed)
Likely just an immune response to the medicine, keep a close eye on it and alternate tylenol and ibuprofen. If severely worsening over weekend, go to [facility] or ER for evaluation but as long as you're feeling well overall and it is controlled with medicine it should be ok

## 2016-04-03 NOTE — Telephone Encounter (Signed)
Patient notified

## 2016-04-04 NOTE — Addendum Note (Signed)
Addended by: Kittie Plater on: 04/04/2016 01:08 PM   Modules accepted: Orders

## 2016-04-17 ENCOUNTER — Encounter: Payer: Self-pay | Admitting: Family Medicine

## 2016-04-18 ENCOUNTER — Other Ambulatory Visit: Payer: Self-pay | Admitting: Family Medicine

## 2016-04-18 DIAGNOSIS — F3181 Bipolar II disorder: Secondary | ICD-10-CM

## 2016-04-18 DIAGNOSIS — F988 Other specified behavioral and emotional disorders with onset usually occurring in childhood and adolescence: Secondary | ICD-10-CM

## 2016-04-18 DIAGNOSIS — F419 Anxiety disorder, unspecified: Secondary | ICD-10-CM

## 2016-04-25 DIAGNOSIS — M9901 Segmental and somatic dysfunction of cervical region: Secondary | ICD-10-CM | POA: Diagnosis not present

## 2016-04-25 DIAGNOSIS — M5416 Radiculopathy, lumbar region: Secondary | ICD-10-CM | POA: Diagnosis not present

## 2016-04-25 DIAGNOSIS — R51 Headache: Secondary | ICD-10-CM | POA: Diagnosis not present

## 2016-04-25 DIAGNOSIS — M9903 Segmental and somatic dysfunction of lumbar region: Secondary | ICD-10-CM | POA: Diagnosis not present

## 2016-04-28 DIAGNOSIS — M9903 Segmental and somatic dysfunction of lumbar region: Secondary | ICD-10-CM | POA: Diagnosis not present

## 2016-04-28 DIAGNOSIS — R51 Headache: Secondary | ICD-10-CM | POA: Diagnosis not present

## 2016-04-28 DIAGNOSIS — M5416 Radiculopathy, lumbar region: Secondary | ICD-10-CM | POA: Diagnosis not present

## 2016-04-28 DIAGNOSIS — M9901 Segmental and somatic dysfunction of cervical region: Secondary | ICD-10-CM | POA: Diagnosis not present

## 2016-04-29 ENCOUNTER — Telehealth: Payer: Self-pay | Admitting: Family Medicine

## 2016-04-29 DIAGNOSIS — M26609 Unspecified temporomandibular joint disorder, unspecified side: Secondary | ICD-10-CM | POA: Diagnosis not present

## 2016-04-29 DIAGNOSIS — Z0000 Encounter for general adult medical examination without abnormal findings: Secondary | ICD-10-CM

## 2016-04-29 DIAGNOSIS — J301 Allergic rhinitis due to pollen: Secondary | ICD-10-CM | POA: Diagnosis not present

## 2016-04-29 DIAGNOSIS — H93299 Other abnormal auditory perceptions, unspecified ear: Secondary | ICD-10-CM | POA: Diagnosis not present

## 2016-04-29 DIAGNOSIS — H9209 Otalgia, unspecified ear: Secondary | ICD-10-CM | POA: Diagnosis not present

## 2016-04-29 NOTE — Telephone Encounter (Signed)
Yes, I will follow up on status of appointment with [facility].

## 2016-04-29 NOTE — Telephone Encounter (Signed)
Jasmine Tran: Is this something that you can take care of since we do not a steady person doing them now?  Dr.Johnson: Can you place patient's labs or write an order.

## 2016-04-29 NOTE — Telephone Encounter (Signed)
Referral for psychiatry was put in 12/1 and appears to have been directed to [place]. It's waiting to be scheduled by them. I've got her orders in as future orders. So yes, she can go ahead of time.

## 2016-04-30 DIAGNOSIS — M5416 Radiculopathy, lumbar region: Secondary | ICD-10-CM | POA: Diagnosis not present

## 2016-04-30 DIAGNOSIS — R51 Headache: Secondary | ICD-10-CM | POA: Diagnosis not present

## 2016-04-30 DIAGNOSIS — M9903 Segmental and somatic dysfunction of lumbar region: Secondary | ICD-10-CM | POA: Diagnosis not present

## 2016-04-30 DIAGNOSIS — M9901 Segmental and somatic dysfunction of cervical region: Secondary | ICD-10-CM | POA: Diagnosis not present

## 2016-04-30 NOTE — Telephone Encounter (Signed)
Lab orders printed and placed up front for patient to pick up, patient notified.

## 2016-05-01 ENCOUNTER — Other Ambulatory Visit: Payer: Self-pay

## 2016-05-01 ENCOUNTER — Ambulatory Visit (INDEPENDENT_AMBULATORY_CARE_PROVIDER_SITE_OTHER): Payer: BLUE CROSS/BLUE SHIELD | Admitting: Gastroenterology

## 2016-05-01 ENCOUNTER — Encounter: Payer: Self-pay | Admitting: Gastroenterology

## 2016-05-01 VITALS — BP 127/77 | HR 81 | Temp 98.0°F | Ht 62.0 in | Wt 209.0 lb

## 2016-05-01 DIAGNOSIS — K219 Gastro-esophageal reflux disease without esophagitis: Secondary | ICD-10-CM | POA: Diagnosis not present

## 2016-05-01 DIAGNOSIS — R1011 Right upper quadrant pain: Secondary | ICD-10-CM

## 2016-05-01 DIAGNOSIS — G8929 Other chronic pain: Secondary | ICD-10-CM | POA: Diagnosis not present

## 2016-05-01 NOTE — Progress Notes (Signed)
Primary Care Physician: Park Liter, DO  Primary Gastroenterologist:  Dr. Lucilla Lame  Chief Complaint  Patient presents with  . RLQ abdominal pain    HPI: Jasmine Tran is a 35 y.o. female here with a report of right sided abdominal pain. The patient reports that her right side abdominal pain is in the right upper abdomen right near where she had her scar for her liver resection. The patient reports that she also has some epigastric discomfort in the mornings. He is presently on Protonix but states that she had acid breakthrough at night and got a bed that reclines and raises up so that she can sleep with her head raised. There is no report of any unexplained weight loss. The patient reports that her right upper quadrant pain is not associated with eating or drinking but can be worse when she strains to move her bowels.  Current Outpatient Prescriptions  Medication Sig Dispense Refill  . cyclobenzaprine (FLEXERIL) 5 MG tablet     . EPINEPHrine (EPIPEN 2-PAK) 0.3 mg/0.3 mL IJ SOAJ injection Inject 0.3 mLs (0.3 mg total) into the muscle once. 1 Device 12  . famotidine (PEPCID) 20 MG tablet Take 1 tablet (20 mg total) by mouth 2 (two) times daily. 180 tablet 3  . L-Methylfolate-Algae (DEPLIN 15) 15-90.314 MG CAPS TAKE 1 CAPSULE BY MOUTH EVERY DAY    . lamoTRIgine (LAMICTAL) 200 MG tablet TAKE 1 TABLET DAILY 90 tablet 1  . lisdexamfetamine (VYVANSE) 50 MG capsule Take 1 capsule (50 mg total) by mouth daily. 30 capsule 0  . montelukast (SINGULAIR) 10 MG tablet     . ondansetron (ZOFRAN ODT) 4 MG disintegrating tablet Take 1 tablet (4 mg total) by mouth every 8 (eight) hours as needed for nausea or vomiting. 45 tablet 0  . pantoprazole (PROTONIX) 40 MG tablet Take 1 tablet (40 mg total) by mouth daily. (Patient taking differently: Take 40 mg by mouth daily as needed. ) 90 tablet 3  . amoxicillin-clavulanate (AUGMENTIN) 875-125 MG tablet Take 1 tablet by mouth 2 (two) times daily. (Patient not  taking: Reported on 05/01/2016) 20 tablet 0  . oxcarbazepine (TRILEPTAL) 600 MG tablet TAKE 1 TABLET TWICE A DAY (Patient not taking: Reported on 05/01/2016) 180 tablet 1   No current facility-administered medications for this visit.     Allergies as of 05/01/2016 - Review Complete 05/01/2016  Allergen Reaction Noted  . Tape Dermatitis 04/20/2015  . Doxycycline  03/30/2015  . Morphine and related Hives 03/19/2015  . Phenergan [promethazine hcl] Nausea And Vomiting 03/19/2015  . Sertraline  03/30/2015  . Prednisone Nausea And Vomiting 06/26/2015    ROS:  General: Negative for anorexia, weight loss, fever, chills, fatigue, weakness. ENT: Negative for hoarseness, difficulty swallowing , nasal congestion. CV: Negative for chest pain, angina, palpitations, dyspnea on exertion, peripheral edema.  Respiratory: Negative for dyspnea at rest, dyspnea on exertion, cough, sputum, wheezing.  GI: See history of present illness. GU:  Negative for dysuria, hematuria, urinary incontinence, urinary frequency, nocturnal urination.  Endo: Negative for unusual weight change.    Physical Examination:   BP 127/77   Pulse 81   Temp 98 F (36.7 C) (Oral)   Ht 5\' 2"  (1.575 m)   Wt 209 lb (94.8 kg)   LMP 03/12/2016 (Approximate)   BMI 38.23 kg/m   General: Well-nourished, well-developed in no acute distress.  Eyes: No icterus. Conjunctivae pink. Mouth: Oropharyngeal mucosa moist and pink , no lesions erythema or exudate. Lungs: Clear to  auscultation bilaterally. Non-labored. Heart: Regular rate and rhythm, no murmurs rubs or gallops.  Abdomen: Bowel sounds are normal, positive tenderness to 1 finger palpation while flexing the abdominal wall muscles over the right upper quadrant and ribs, nondistended, no hepatosplenomegaly or masses, no abdominal bruits or hernia , no rebound or guarding.   Extremities: No lower extremity edema. No clubbing or deformities. Neuro: Alert and oriented x 3.  Grossly  intact. Skin: Warm and dry, no jaundice.  There is ecchymosis over the area of the scar where she is having the pain. Psych: Alert and cooperative, normal mood and affect.  Labs:    Imaging Studies: No results found.  Assessment and Plan:   Jasmine Tran is a 35 y.o. y/o female who comes in today with right upper quadrant pain and some epigastric discomfort in the morning. The patient's epigastric discomfort is likely from breakthrough acid reflux at night. The patient has been told to take her Protonix in the evening to see if this helps. If it does not help the patient has been told that she may need to go ahead with her EGD and colonoscopy to look for source of the pain. The patient also has been found to have right upper quadrant pain consistent with musculoskeletal pain. The patient has been explained this and agrees with the plan.    Lucilla Lame, MD. [name]   Note: This dictation was prepared with Dragon dictation along with smaller phrase technology. Any transcriptional errors that result from this process are unintentional.

## 2016-05-02 DIAGNOSIS — M9901 Segmental and somatic dysfunction of cervical region: Secondary | ICD-10-CM | POA: Diagnosis not present

## 2016-05-02 DIAGNOSIS — R51 Headache: Secondary | ICD-10-CM | POA: Diagnosis not present

## 2016-05-02 DIAGNOSIS — M5416 Radiculopathy, lumbar region: Secondary | ICD-10-CM | POA: Diagnosis not present

## 2016-05-02 DIAGNOSIS — M9903 Segmental and somatic dysfunction of lumbar region: Secondary | ICD-10-CM | POA: Diagnosis not present

## 2016-05-06 DIAGNOSIS — R51 Headache: Secondary | ICD-10-CM | POA: Diagnosis not present

## 2016-05-06 DIAGNOSIS — M5416 Radiculopathy, lumbar region: Secondary | ICD-10-CM | POA: Diagnosis not present

## 2016-05-06 DIAGNOSIS — M9903 Segmental and somatic dysfunction of lumbar region: Secondary | ICD-10-CM | POA: Diagnosis not present

## 2016-05-06 DIAGNOSIS — M9901 Segmental and somatic dysfunction of cervical region: Secondary | ICD-10-CM | POA: Diagnosis not present

## 2016-05-07 ENCOUNTER — Ambulatory Visit: Payer: BLUE CROSS/BLUE SHIELD | Admitting: Family Medicine

## 2016-05-07 DIAGNOSIS — M9903 Segmental and somatic dysfunction of lumbar region: Secondary | ICD-10-CM | POA: Diagnosis not present

## 2016-05-07 DIAGNOSIS — M5416 Radiculopathy, lumbar region: Secondary | ICD-10-CM | POA: Diagnosis not present

## 2016-05-07 DIAGNOSIS — R51 Headache: Secondary | ICD-10-CM | POA: Diagnosis not present

## 2016-05-07 DIAGNOSIS — M9901 Segmental and somatic dysfunction of cervical region: Secondary | ICD-10-CM | POA: Diagnosis not present

## 2016-05-08 ENCOUNTER — Ambulatory Visit: Payer: BLUE CROSS/BLUE SHIELD | Admitting: Family Medicine

## 2016-05-08 ENCOUNTER — Encounter: Payer: Self-pay | Admitting: Family Medicine

## 2016-05-08 ENCOUNTER — Ambulatory Visit (INDEPENDENT_AMBULATORY_CARE_PROVIDER_SITE_OTHER): Payer: BLUE CROSS/BLUE SHIELD | Admitting: Family Medicine

## 2016-05-08 ENCOUNTER — Encounter (HOSPITAL_COMMUNITY): Payer: Self-pay

## 2016-05-08 VITALS — BP 110/75 | HR 80 | Temp 97.5°F | Wt 208.2 lb

## 2016-05-08 DIAGNOSIS — J329 Chronic sinusitis, unspecified: Secondary | ICD-10-CM | POA: Diagnosis not present

## 2016-05-08 DIAGNOSIS — N939 Abnormal uterine and vaginal bleeding, unspecified: Secondary | ICD-10-CM

## 2016-05-08 MED ORDER — ONDANSETRON 4 MG PO TBDP: 4.0000 mg | ORAL_TABLET | Freq: Three times a day (TID) | ORAL | 0 refills | Status: DC | PRN

## 2016-05-08 MED ORDER — AZITHROMYCIN 250 MG PO TABS: ORAL_TABLET | ORAL | 0 refills | Status: DC

## 2016-05-08 MED ORDER — TRIAMCINOLONE ACETONIDE 40 MG/ML IJ SUSP
40.0000 mg | Freq: Once | INTRAMUSCULAR | Status: AC
  Administered 2016-05-08: 40 mg via INTRAMUSCULAR

## 2016-05-08 NOTE — Progress Notes (Signed)
BP 110/75 (BP Location: Left Arm, Patient Position: Sitting, Cuff Size: Large)   Pulse 80   Temp 97.5 F (36.4 C)   Wt 208 lb 3.2 oz (94.4 kg)   LMP 04/17/2016   SpO2 98%   BMI 38.08 kg/m    Subjective:    Patient ID: Jasmine Tran, female    DOB: 05-Sep-1980, 35 y.o.   MRN: [id number]  HPI: Jasmine Tran is a 35 y.o. female  Chief Complaint  Patient presents with  . Abnormal periods    Patient states that she has been bleeding for three weeks, it is mostly heavy. Over the last 3 weeks there has been 3-4 days that were lighter   Patient presents with 3 week history of moderate to heavy constant menstrual bleeding. Also c/o increased cramping and discomfort with this period than she normally would have. Hx of PCOS, so has had historically irregular cycles but has never had a cycle longer than 1 week. Has been off hormonal contraception for over a year now s/p resection of liver adenomas that she was told were hormone-related and that she should stop OCPs. Concerned about anemia as she has been feeling fatigued, dizzy, and SOB since onset of bleeding. Not taking any supplements currently. Denies urinary sxs, N/V/D, abdominal pain, fevers, concern for pregnancy or STIs.   Also c/o recurrent sinus infections for which she has been told she eventually needs surgical repair for with ENT. Did well with kenalog injection and abx several months ago and had full resolution, with sxs returning the past 2 weeks. Having sinus pressure and pain, congestion, sinus HAs, and cough. Denies fever, chills, SOB, or wheezing.   Past Medical History:  Diagnosis Date  . ADD (attention deficit disorder)   . Bipolar 1 disorder with moderate mania ([facility])    Social History   Social History  . Marital status: Married    Spouse name: N/A  . Number of children: N/A  . Years of education: N/A   Occupational History  . Not on file.   Social History Main Topics  . Smoking status: Former Smoker    Packs/day:  0.25    Years: 5.00    Types: Cigarettes    Quit date: 06/04/2015  . Smokeless tobacco: Never Used  . Alcohol use No  . Drug use: No  . Sexual activity: Yes    Birth control/ protection: None   Other Topics Concern  . Not on file   Social History Narrative  . No narrative on file    Relevant past medical, surgical, family and social history reviewed and updated as indicated. Interim medical history since our last visit reviewed. Allergies and medications reviewed and updated.  Review of Systems  Constitutional: Negative.   HENT: Positive for congestion, sinus pain and sinus pressure.   Respiratory: Positive for cough.   Cardiovascular: Negative.   Gastrointestinal: Negative.   Genitourinary: Positive for menstrual problem.  Musculoskeletal: Negative.   Neurological: Positive for headaches.  Psychiatric/Behavioral: Negative.     Per HPI unless specifically indicated above     Objective:    BP 110/75 (BP Location: Left Arm, Patient Position: Sitting, Cuff Size: Large)   Pulse 80   Temp 97.5 F (36.4 C)   Wt 208 lb 3.2 oz (94.4 kg)   LMP 04/17/2016   SpO2 98%   BMI 38.08 kg/m   Wt Readings from Last 3 Encounters:  05/08/16 208 lb 3.2 oz (94.4 kg)  05/01/16 209 lb (94.8 kg)  04/02/16 210 lb (95.3 kg)    Physical Exam  Constitutional: She is oriented to person, place, and time. She appears well-developed and well-nourished.  HENT:  Head: Atraumatic.  Eyes: Conjunctivae are normal. Pupils are equal, round, and reactive to light. No scleral icterus.  Neck: Normal range of motion. Neck supple.  Cardiovascular: Normal rate, regular rhythm and normal heart sounds.   Pulmonary/Chest: Effort normal and breath sounds normal. No respiratory distress.  Abdominal: Soft. Bowel sounds are normal. She exhibits no distension and no mass. There is no tenderness.  Musculoskeletal: Normal range of motion.  Neurological: She is alert and oriented to person, place, and time.    Skin: Skin is warm and dry.  Psychiatric: She has a normal mood and affect. Her behavior is normal.  Nursing note and vitals reviewed.     Assessment & Plan:   Problem List Items Addressed This Visit    None    Visit Diagnoses    Abnormal uterine bleeding    -  Primary   Await U/A and TSH. Referral to OBGYN placed for further evaluation as needed.    Relevant Orders   CBC With Differential/Platelet (Completed)   UA/M w/rflx Culture, Routine (Completed)   TSH (Completed)   Pregnancy, urine (Completed)   Ambulatory referral to Gynecology   Recurrent sinusitis       Will treat with IM kenalog and azithromycin. Pt aware that she will have to have sinus surgery at some point. Considering getting it done spring/summertime   Relevant Medications   azithromycin (ZITHROMAX) 250 MG tablet   triamcinolone acetonide (KENALOG-40) injection 40 mg (Completed)       Follow up plan: Return if symptoms worsen or fail to improve.

## 2016-05-09 ENCOUNTER — Telehealth: Payer: Self-pay | Admitting: Family Medicine

## 2016-05-09 LAB — CBC WITH DIFFERENTIAL/PLATELET
HEMATOCRIT: 41.7 % (ref 34.0–46.6)
HEMOGLOBIN: 14.3 g/dL (ref 11.1–15.9)
LYMPHS ABS: 2.2 10*3/uL (ref 0.7–3.1)
LYMPHS: 41 %
MCH: 30.9 pg (ref 26.6–33.0)
MCHC: 34.3 g/dL (ref 31.5–35.7)
MCV: 90 fL (ref 79–97)
MID (ABSOLUTE): 0.4 10*3/uL (ref 0.1–1.6)
MID: 8 %
NEUTROS PCT: 51 %
Neutrophils Absolute: 2.8 10*3/uL (ref 1.4–7.0)
Platelets: 268 10*3/uL (ref 150–379)
RBC: 4.63 x10E6/uL (ref 3.77–5.28)
RDW: 13.5 % (ref 12.3–15.4)
WBC: 5.4 10*3/uL (ref 3.4–10.8)

## 2016-05-09 LAB — PREGNANCY, URINE: Preg Test, Ur: NEGATIVE

## 2016-05-09 LAB — URINE CULTURE, REFLEX

## 2016-05-09 LAB — UA/M W/RFLX CULTURE, ROUTINE
BILIRUBIN UA: NEGATIVE
Glucose, UA: NEGATIVE
KETONES UA: NEGATIVE
Leukocytes, UA: NEGATIVE
Nitrite, UA: NEGATIVE
PH UA: 5.5 (ref 5.0–7.5)
Specific Gravity, UA: 1.025 (ref 1.005–1.030)
UUROB: 0.2 mg/dL (ref 0.2–1.0)

## 2016-05-09 LAB — MICROSCOPIC EXAMINATION

## 2016-05-09 LAB — TSH: TSH: 2.63 u[IU]/mL (ref 0.450–4.500)

## 2016-05-09 MED ORDER — SULFAMETHOXAZOLE-TRIMETHOPRIM 800-160 MG PO TABS: 1.0000 | ORAL_TABLET | Freq: Two times a day (BID) | ORAL | 0 refills | Status: DC

## 2016-05-09 NOTE — Patient Instructions (Signed)
Follow up as needed

## 2016-05-09 NOTE — Telephone Encounter (Signed)
Completely fine to start the bactrim at the tail end of the z-pak - I just know she is very sensitive to the medicines and trying not to induce tons of N/V by overloading her with two at the same time. Thanks!

## 2016-05-09 NOTE — Telephone Encounter (Signed)
Notified patient.  Patient said she started her Z-Pack yesterday. Already took 2 pills.  I advised patient since she started the course to not change unless Jasmine Tran said so to, so I would ask Jasmine Tran to advise and I'd call her back.

## 2016-05-09 NOTE — Telephone Encounter (Signed)
Please call pt and let her know that her urine microscopy showed a possible UTI - still awaiting culture, but will go ahead and send in an additional antibiotic to help treat this. I would advise that she take the 3 days of bactrim first, then complete the z-pak for her sinusitis. The bactrim should get things started with her sinus sxs as well as target the UTI.

## 2016-05-09 NOTE — Telephone Encounter (Signed)
Patient notified

## 2016-05-15 ENCOUNTER — Encounter: Payer: Self-pay | Admitting: Family Medicine

## 2016-05-15 ENCOUNTER — Ambulatory Visit (INDEPENDENT_AMBULATORY_CARE_PROVIDER_SITE_OTHER): Payer: BLUE CROSS/BLUE SHIELD | Admitting: Family Medicine

## 2016-05-15 VITALS — BP 112/73 | HR 64 | Temp 97.4°F | Ht 62.6 in | Wt 205.6 lb

## 2016-05-15 DIAGNOSIS — R5383 Other fatigue: Secondary | ICD-10-CM

## 2016-05-15 DIAGNOSIS — Z23 Encounter for immunization: Secondary | ICD-10-CM | POA: Diagnosis not present

## 2016-05-15 DIAGNOSIS — F988 Other specified behavioral and emotional disorders with onset usually occurring in childhood and adolescence: Secondary | ICD-10-CM

## 2016-05-15 DIAGNOSIS — Z0001 Encounter for general adult medical examination with abnormal findings: Secondary | ICD-10-CM

## 2016-05-15 DIAGNOSIS — Z0000 Encounter for general adult medical examination without abnormal findings: Secondary | ICD-10-CM | POA: Diagnosis not present

## 2016-05-15 MED ORDER — OXCARBAZEPINE 600 MG PO TABS
600.0000 mg | ORAL_TABLET | Freq: Two times a day (BID) | ORAL | 1 refills | Status: DC
Start: 2016-05-15 — End: 2016-10-28

## 2016-05-15 MED ORDER — DEPLIN 15 15-90.314 MG PO CAPS: 1.0000 | ORAL_CAPSULE | Freq: Every day | ORAL | 3 refills | Status: DC

## 2016-05-15 MED ORDER — LISDEXAMFETAMINE DIMESYLATE 50 MG PO CAPS: 50.0000 mg | ORAL_CAPSULE | Freq: Every day | ORAL | 0 refills | Status: DC

## 2016-05-15 MED ORDER — LAMOTRIGINE 200 MG PO TABS: 200.0000 mg | ORAL_TABLET | Freq: Every day | ORAL | 1 refills | Status: DC

## 2016-05-15 NOTE — Progress Notes (Signed)
BP 112/73 (BP Location: Left Arm, Patient Position: Sitting, Cuff Size: Large)   Pulse 64   Temp 97.4 F (36.3 C)   Ht 5' 2.6" (1.59 m)   Wt 205 lb 9.6 oz (93.3 kg)   LMP 04/17/2016 (Exact Date)   SpO2 96%   BMI 36.89 kg/m    Subjective:    Patient ID: Jasmine Tran, female    DOB: 01-Nov-1980, 35 y.o.   MRN: [id number]  HPI: Shenita Sandifer is a 35 y.o. female presenting on 05/15/2016 for comprehensive medical examination. Current medical complaints include:  Dysfunctional uterine bleeding has resolved with antibiotics- hasn't heard from the GYN yet.   ADHD FOLLOW UP- insurance won't cover vyvanse any more ADHD status: stable Satisfied with current therapy: yes Medication compliance:  excellent compliance Controlled substance contract: yes Previous psychiatry evaluation: yes Previous medications: yes    Taking meds on weekends/vacations: yes Work/school performance:  good Difficulty sustaining attention/completing tasks: no Distracted by extraneous stimuli: no Does not listen when spoken to: no  Fidgets with hands or feet: no Unable to stay in seat: no Blurts out/interrupts others: no ADHD Medication Side Effects: no  She currently lives with: husband Menopausal Symptoms: no  Depression Screen done today and results listed below:  Depression screen [facility] 2/9 05/15/2016 05/15/2015  Decreased Interest 0 0  Down, Depressed, Hopeless 0 0  PHQ - 2 Score 0 0    Past Medical History:  Past Medical History:  Diagnosis Date  . ADD (attention deficit disorder)   . Bipolar 1 disorder with moderate mania ([place])     Surgical History:  Past Surgical History:  Procedure Laterality Date  . APPENDECTOMY    . CHOLECYSTECTOMY    . OPEN HEPATECTOMY   06/04/2015   Liver adenoma removal  . TUMOR REMOVAL     Liver, benign    Medications:  Current Outpatient Prescriptions on File Prior to Visit  Medication Sig  . EPINEPHrine (EPIPEN 2-PAK) 0.3 mg/0.3 mL IJ SOAJ injection  Inject 0.3 mLs (0.3 mg total) into the muscle once.  . famotidine (PEPCID) 20 MG tablet Take 1 tablet (20 mg total) by mouth 2 (two) times daily.  . montelukast (SINGULAIR) 10 MG tablet    No current facility-administered medications on file prior to visit.     Allergies:  Allergies  Allergen Reactions  . Tape Dermatitis  . Doxycycline     Other reaction(s): VOMITING  . Morphine And Related Hives  . Phenergan [Promethazine Hcl] Nausea And Vomiting  . Sertraline     Other reaction(s): NAUSEA, PUPILS DILATE  . Prednisone Nausea And Vomiting    Social History:  Social History   Social History  . Marital status: Married    Spouse name: N/A  . Number of children: N/A  . Years of education: N/A   Occupational History  . Not on file.   Social History Main Topics  . Smoking status: Former Smoker    Packs/day: 0.25    Years: 5.00    Types: Cigarettes    Quit date: 06/04/2015  . Smokeless tobacco: Never Used  . Alcohol use No  . Drug use: No  . Sexual activity: Yes    Birth control/ protection: None   Other Topics Concern  . Not on file   Social History Narrative  . No narrative on file   History  Smoking Status  . Former Smoker  . Packs/day: 0.25  . Years: 5.00  . Types: Cigarettes  . Quit date:  06/04/2015  Smokeless Tobacco  . Never Used   History  Alcohol Use No    Family History:  Family History  Problem Relation Age of Onset  . Heart disease Mother   . Heart disease Father   . Hypertension Brother   . Cancer Neg Hx   . COPD Neg Hx   . Diabetes Neg Hx   . Stroke Neg Hx     Past medical history, surgical history, medications, allergies, family history and social history reviewed with patient today and changes made to appropriate areas of the chart.   Review of Systems  Constitutional: Positive for diaphoresis and malaise/fatigue. Negative for chills, fever and weight loss.  HENT: Negative.   Eyes: Negative.   Respiratory: Negative.     Cardiovascular: Negative.   Gastrointestinal: Positive for heartburn. Negative for abdominal pain, blood in stool, constipation, diarrhea, melena, nausea and vomiting.  Genitourinary: Negative.   Musculoskeletal: Negative.   Skin: Negative.   Neurological: Positive for dizziness (only with the last week on abx). Negative for tingling, tremors, sensory change, speech change, focal weakness, seizures, loss of consciousness, weakness and headaches.  Endo/Heme/Allergies: Negative.   Psychiatric/Behavioral: Negative.     All other ROS negative except what is listed above and in the HPI.      Objective:    BP 112/73 (BP Location: Left Arm, Patient Position: Sitting, Cuff Size: Large)   Pulse 64   Temp 97.4 F (36.3 C)   Ht 5' 2.6" (1.59 m)   Wt 205 lb 9.6 oz (93.3 kg)   LMP 04/17/2016 (Exact Date)   SpO2 96%   BMI 36.89 kg/m   Wt Readings from Last 3 Encounters:  05/15/16 205 lb 9.6 oz (93.3 kg)  05/08/16 208 lb 3.2 oz (94.4 kg)  05/01/16 209 lb (94.8 kg)    Physical Exam  Constitutional: She is oriented to person, place, and time. She appears well-developed and well-nourished. No distress.  HENT:  Head: Normocephalic and atraumatic.  Right Ear: Hearing, tympanic membrane, external ear and ear canal normal.  Left Ear: Hearing, tympanic membrane, external ear and ear canal normal.  Nose: Nose normal.  Mouth/Throat: Uvula is midline, oropharynx is clear and moist and mucous membranes are normal. No oropharyngeal exudate.  Eyes: Conjunctivae, EOM and lids are normal. Pupils are equal, round, and reactive to light. Right eye exhibits no discharge. Left eye exhibits no discharge. No scleral icterus.  Neck: Normal range of motion. Neck supple. No JVD present. No tracheal deviation present. No thyromegaly present.  Cardiovascular: Normal rate, regular rhythm, normal heart sounds and intact distal pulses.  Exam reveals no gallop and no friction rub.   No murmur heard. Pulmonary/Chest:  Effort normal and breath sounds normal. No stridor. No respiratory distress. She has no wheezes. She has no rales. She exhibits no tenderness. Right breast exhibits no inverted nipple, no mass, no nipple discharge, no skin change and no tenderness. Left breast exhibits no inverted nipple, no mass, no nipple discharge, no skin change and no tenderness. Breasts are symmetrical.  Abdominal: Soft. Bowel sounds are normal. She exhibits no distension and no mass. There is tenderness (RUQ). There is no rebound and no guarding.  Genitourinary:  Genitourinary Comments: Pelvic exam deferred with shared decision making.   Musculoskeletal: Normal range of motion. She exhibits no edema, tenderness or deformity.  Lymphadenopathy:    She has no cervical adenopathy.  Neurological: She is alert and oriented to person, place, and time. She has normal reflexes. She  displays normal reflexes. No cranial nerve deficit. She exhibits normal muscle tone. Coordination normal.  Skin: Skin is warm, dry and intact. No rash noted. She is not diaphoretic. No erythema. No pallor.  Psychiatric: She has a normal mood and affect. Her speech is normal and behavior is normal. Judgment and thought content normal. Cognition and memory are normal.  Nursing note and vitals reviewed.   Results for orders placed or performed in visit on 05/08/16  Microscopic Examination  Result Value Ref Range   WBC, UA 0-5 0 - 5 /hpf   RBC, UA 3-10 (A) 0 - 2 /hpf   Epithelial Cells (non renal) 0-10 0 - 10 /hpf   Mucus, UA Present (A) Not Estab.   Bacteria, UA Many (A) None seen/Few  CBC With Differential/Platelet  Result Value Ref Range   WBC 5.4 3.4 - 10.8 x10E3/uL   RBC 4.63 3.77 - 5.28 x10E6/uL   Hemoglobin 14.3 11.1 - 15.9 g/dL   Hematocrit 41.7 34.0 - 46.6 %   MCV 90 79 - 97 fL   MCH 30.9 26.6 - 33.0 pg   MCHC 34.3 31.5 - 35.7 g/dL   RDW 13.5 12.3 - 15.4 %   Platelets 268 150 - 379 x10E3/uL   Neutrophils 51 Not Estab. %   Lymphs 41 Not  Estab. %   MID 8 Not Estab. %   Neutrophils Absolute 2.8 1.4 - 7.0 x10E3/uL   Lymphocytes Absolute 2.2 0.7 - 3.1 x10E3/uL   MID (Absolute) 0.4 0.1 - 1.6 X10E3/uL  UA/M w/rflx Culture, Routine  Result Value Ref Range   Specific Gravity, UA 1.025 1.005 - 1.030   pH, UA 5.5 5.0 - 7.5   Color, UA Yellow Yellow   Appearance Ur Cloudy (A) Clear   Leukocytes, UA Negative Negative   Protein, UA Trace (A) Negative/Trace   Glucose, UA Negative Negative   Ketones, UA Negative Negative   RBC, UA 3+ (A) Negative   Bilirubin, UA Negative Negative   Urobilinogen, Ur 0.2 0.2 - 1.0 mg/dL   Nitrite, UA Negative Negative   Microscopic Examination See below:    Urinalysis Reflex Comment   TSH  Result Value Ref Range   TSH 2.630 0.450 - 4.500 uIU/mL  Pregnancy, urine  Result Value Ref Range   Preg Test, Ur Negative Negative  Urine Culture, Routine  Result Value Ref Range   Urine Culture, Routine Final report    Urine Culture result 1 Comment       Assessment & Plan:   Problem List Items Addressed This Visit      Other   ADD (attention deficit disorder)    Under good control. May need to change due to insurance issues. Seeing psychiatry in February. Call with any concerns. 3 month supply given today.      Fatigue   Relevant Orders   Lyme Ab/Western Blot Reflex   Rocky mtn spotted fvr abs pnl(IgG+IgM)   Babesia microti Antibody Panel   Ehrlichia Antibody Panel    Other Visit Diagnoses    Routine general medical examination at a health care facility    -  Primary   Vaccines updated. Screening labs checked today. Pap up to date. Continue diet and exercise.    Relevant Orders   Nicotine/cotinine metabolites   Immunization due       Tdap given today.   Relevant Orders   Tdap vaccine greater than or equal to 34yo IM (Completed)       Follow up plan:  Return in about 3 months (around 08/13/2016) for ADD follow up.   LABORATORY TESTING:  - Pap smear: up to date  IMMUNIZATIONS:   -  Tdap: Tetanus vaccination status reviewed: Tdap vaccination indicated and given today. - Influenza: Refused - Pneumovax: Not applicable  PATIENT COUNSELING:   Advised to take 1 mg of folate supplement per day if capable of pregnancy.   Sexuality: Discussed sexually transmitted diseases, partner selection, use of condoms, avoidance of unintended pregnancy  and contraceptive alternatives.   Advised to avoid cigarette smoking.  I discussed with the patient that most people either abstain from alcohol or drink within safe limits (<=14/week and <=4 drinks/occasion for males, <=7/weeks and <= 3 drinks/occasion for females) and that the risk for alcohol disorders and other health effects rises proportionally with the number of drinks per week and how often a drinker exceeds daily limits.  Discussed cessation/primary prevention of drug use and availability of treatment for abuse.   Diet: Encouraged to adjust caloric intake to maintain  or achieve ideal body weight, to reduce intake of dietary saturated fat and total fat, to limit sodium intake by avoiding high sodium foods and not adding table salt, and to maintain adequate dietary potassium and calcium preferably from fresh fruits, vegetables, and low-fat dairy products.    stressed the importance of regular exercise  Injury prevention: Discussed safety belts, safety helmets, smoke detector, smoking near bedding or upholstery.   Dental health: Discussed importance of regular tooth brushing, flossing, and dental visits.    NEXT PREVENTATIVE PHYSICAL DUE IN 1 YEAR. Return in about 3 months (around 08/13/2016) for ADD follow up.

## 2016-05-15 NOTE — Patient Instructions (Addendum)
Health Maintenance, Female Introduction Adopting a healthy lifestyle and getting preventive care can go a long way to promote health and wellness. Talk with your health care provider about what schedule of regular examinations is right for you. This is a good chance for you to check in with your provider about disease prevention and staying healthy. In between checkups, there are plenty of things you can do on your own. Experts have done a lot of research about which lifestyle changes and preventive measures are most likely to keep you healthy. Ask your health care provider for more information. Weight and diet Eat a healthy diet  Be sure to include plenty of vegetables, fruits, low-fat dairy products, and lean protein.  Do not eat a lot of foods high in solid fats, added sugars, or salt.  Get regular exercise. This is one of the most important things you can do for your health.  Most adults should exercise for at least 150 minutes each week. The exercise should increase your heart rate and make you sweat (moderate-intensity exercise).  Most adults should also do strengthening exercises at least twice a week. This is in addition to the moderate-intensity exercise. Maintain a healthy weight  Body mass index (BMI) is a measurement that can be used to identify possible weight problems. It estimates body fat based on height and weight. Your health care provider can help determine your BMI and help you achieve or maintain a healthy weight.  For females 63 years of age and older:  A BMI below 18.5 is considered underweight.  A BMI of 18.5 to 24.9 is normal.  A BMI of 25 to 29.9 is considered overweight.  A BMI of 30 and above is considered obese. Watch levels of cholesterol and blood lipids  You should start having your blood tested for lipids and cholesterol at 34 years of age, then have this test every 5 years.  You may need to have your cholesterol levels checked more often if:  Your  lipid or cholesterol levels are high.  You are older than 34 years of age.  You are at high risk for heart disease. Cancer screening Lung Cancer  Lung cancer screening is recommended for adults 56-22 years old who are at high risk for lung cancer because of a history of smoking.  A yearly low-dose CT scan of the lungs is recommended for people who:  Currently smoke.  Have quit within the past 15 years.  Have at least a 30-pack-year history of smoking. A pack year is smoking an average of one pack of cigarettes a day for 1 year.  Yearly screening should continue until it has been 15 years since you quit.  Yearly screening should stop if you develop a health problem that would prevent you from having lung cancer treatment. Breast Cancer  Practice breast self-awareness. This means understanding how your breasts normally appear and feel.  It also means doing regular breast self-exams. Let your health care provider know about any changes, no matter how small.  If you are in your 20s or 30s, you should have a clinical breast exam (CBE) by a health care provider every 1-3 years as part of a regular health exam.  If you are 35 or older, have a CBE every year. Also consider having a breast X-ray (mammogram) every year.  If you have a family history of breast cancer, talk to your health care provider about genetic screening.  If you are at high risk for breast cancer,  talk to your health care provider about having an MRI and a mammogram every year.  Breast cancer gene (BRCA) assessment is recommended for women who have family members with BRCA-related cancers. BRCA-related cancers include:  Breast.  Ovarian.  Tubal.  Peritoneal cancers.  Results of the assessment will determine the need for genetic counseling and BRCA1 and BRCA2 testing. Cervical Cancer  Your health care provider may recommend that you be screened regularly for cancer of the pelvic organs (ovaries, uterus, and  vagina). This screening involves a pelvic examination, including checking for microscopic changes to the surface of your cervix (Pap test). You may be encouraged to have this screening done every 3 years, beginning at age 21.  For women ages 30-65, health care providers may recommend pelvic exams and Pap testing every 3 years, or they may recommend the Pap and pelvic exam, combined with testing for human papilloma virus (HPV), every 5 years. Some types of HPV increase your risk of cervical cancer. Testing for HPV may also be done on women of any age with unclear Pap test results.  Other health care providers may not recommend any screening for nonpregnant women who are considered low risk for pelvic cancer and who do not have symptoms. Ask your health care provider if a screening pelvic exam is right for you.  If you have had past treatment for cervical cancer or a condition that could lead to cancer, you need Pap tests and screening for cancer for at least 20 years after your treatment. If Pap tests have been discontinued, your risk factors (such as having a new sexual partner) need to be reassessed to determine if screening should resume. Some women have medical problems that increase the chance of getting cervical cancer. In these cases, your health care provider may recommend more frequent screening and Pap tests. Colorectal Cancer  This type of cancer can be detected and often prevented.  Routine colorectal cancer screening usually begins at 34 years of age and continues through 34 years of age.  Your health care provider may recommend screening at an earlier age if you have risk factors for colon cancer.  Your health care provider may also recommend using home test kits to check for hidden blood in the stool.  A small camera at the end of a tube can be used to examine your colon directly (sigmoidoscopy or colonoscopy). This is done to check for the earliest forms of colorectal  cancer.  Routine screening usually begins at age 50.  Direct examination of the colon should be repeated every 5-10 years through 34 years of age. However, you may need to be screened more often if early forms of precancerous polyps or small growths are found. Skin Cancer  Check your skin from head to toe regularly.  Tell your health care provider about any new moles or changes in moles, especially if there is a change in a mole's shape or color.  Also tell your health care provider if you have a mole that is larger than the size of a pencil eraser.  Always use sunscreen. Apply sunscreen liberally and repeatedly throughout the day.  Protect yourself by wearing long sleeves, pants, a wide-brimmed hat, and sunglasses whenever you are outside. Heart disease, diabetes, and high blood pressure  High blood pressure causes heart disease and increases the risk of stroke. High blood pressure is more likely to develop in:  People who have blood pressure in the high end of the normal range (130-139/85-89 mm Hg).    People who are overweight or obese.  People who are African American.  If you are 18-39 years of age, have your blood pressure checked every 3-5 years. If you are 40 years of age or older, have your blood pressure checked every year. You should have your blood pressure measured twice-once when you are at a hospital or clinic, and once when you are not at a hospital or clinic. Record the average of the two measurements. To check your blood pressure when you are not at a hospital or clinic, you can use:  An automated blood pressure machine at a pharmacy.  A home blood pressure monitor.  If you are between 55 years and 79 years old, ask your health care provider if you should take aspirin to prevent strokes.  Have regular diabetes screenings. This involves taking a blood sample to check your fasting blood sugar level.  If you are at a normal weight and have a low risk for diabetes,  have this test once every three years after 34 years of age.  If you are overweight and have a high risk for diabetes, consider being tested at a younger age or more often. Preventing infection Hepatitis B  If you have a higher risk for hepatitis B, you should be screened for this virus. You are considered at high risk for hepatitis B if:  You were born in a country where hepatitis B is common. Ask your health care provider which countries are considered high risk.  Your parents were born in a high-risk country, and you have not been immunized against hepatitis B (hepatitis B vaccine).  You have HIV or AIDS.  You use needles to inject street drugs.  You live with someone who has hepatitis B.  You have had sex with someone who has hepatitis B.  You get hemodialysis treatment.  You take certain medicines for conditions, including cancer, organ transplantation, and autoimmune conditions. Hepatitis C  Blood testing is recommended for:  Everyone born from 1945 through 1965.  Anyone with known risk factors for hepatitis C. Sexually transmitted infections (STIs)  You should be screened for sexually transmitted infections (STIs) including gonorrhea and chlamydia if:  You are sexually active and are younger than 34 years of age.  You are older than 34 years of age and your health care provider tells you that you are at risk for this type of infection.  Your sexual activity has changed since you were last screened and you are at an increased risk for chlamydia or gonorrhea. Ask your health care provider if you are at risk.  If you do not have HIV, but are at risk, it may be recommended that you take a prescription medicine daily to prevent HIV infection. This is called pre-exposure prophylaxis (PrEP). You are considered at risk if:  You are sexually active and do not regularly use condoms or know the HIV status of your partner(s).  You take drugs by injection.  You are sexually  active with a partner who has HIV. Talk with your health care provider about whether you are at high risk of being infected with HIV. If you choose to begin PrEP, you should first be tested for HIV. You should then be tested every 3 months for as long as you are taking PrEP. Pregnancy  If you are premenopausal and you may become pregnant, ask your health care provider about preconception counseling.  If you may become pregnant, take 400 to 800 micrograms (mcg) of folic acid   every day.  If you want to prevent pregnancy, talk to your health care provider about birth control (contraception). Osteoporosis and menopause  Osteoporosis is a disease in which the bones lose minerals and strength with aging. This can result in serious bone fractures. Your risk for osteoporosis can be identified using a bone density scan.  If you are 24 years of age or older, or if you are at risk for osteoporosis and fractures, ask your health care provider if you should be screened.  Ask your health care provider whether you should take a calcium or vitamin D supplement to lower your risk for osteoporosis.  Menopause may have certain physical symptoms and risks.  Hormone replacement therapy may reduce some of these symptoms and risks. Talk to your health care provider about whether hormone replacement therapy is right for you. Follow these instructions at home:  Schedule regular health, dental, and eye exams.  Stay current with your immunizations.  Do not use any tobacco products including cigarettes, chewing tobacco, or electronic cigarettes.  If you are pregnant, do not drink alcohol.  If you are breastfeeding, limit how much and how often you drink alcohol.  Limit alcohol intake to no more than 1 drink per day for nonpregnant women. One drink equals 12 ounces of beer, 5 ounces of wine, or 1 ounces of hard liquor.  Do not use street drugs.  Do not share needles.  Ask your health care provider for  help if you need support or information about quitting drugs.  Tell your health care provider if you often feel depressed.  Tell your health care provider if you have ever been abused or do not feel safe at home. This information is not intended to replace advice given to you by your health care provider. Make sure you discuss any questions you have with your health care provider. Document Released: 11/18/2010 Document Revised: 10/11/2015 Document Reviewed: 02/06/2015  2017 Elsevier Tdap Vaccine (Tetanus, Diphtheria and Pertussis): What You Need to Know 1. Why get vaccinated? Tetanus, diphtheria and pertussis are very serious diseases. Tdap vaccine can protect [place] from these diseases. And, Tdap vaccine given to pregnant women can protect newborn babies against pertussis. TETANUS (Lockjaw) is rare in the [place] today. It causes painful muscle tightening and stiffness, usually all over the body.  It can lead to tightening of muscles in the head and neck so you can't open your mouth, swallow, or sometimes even breathe. Tetanus kills about 1 out of 10 people who are infected even after receiving the best medical care. DIPHTHERIA is also rare in the [place] today. It can cause a thick coating to form in the back of the throat.  It can lead to breathing problems, heart failure, paralysis, and death. PERTUSSIS (Whooping Cough) causes severe coughing spells, which can cause difficulty breathing, vomiting and disturbed sleep.  It can also lead to weight loss, incontinence, and rib fractures. Up to 2 in 100 adolescents and 5 in 100 adults with pertussis are hospitalized or have complications, which could include pneumonia or death. These diseases are caused by bacteria. Diphtheria and pertussis are spread from person to person through secretions from coughing or sneezing. Tetanus enters the body through cuts, scratches, or wounds. Before vaccines, as many as 200,000 cases of diphtheria,  200,000 cases of pertussis, and hundreds of cases of tetanus, were reported in the [place] each year. Since vaccination began, reports of cases for tetanus and diphtheria have dropped by about 99% and  for pertussis by about 80%. 2. Tdap vaccine Tdap vaccine can protect adolescents and adults from tetanus, diphtheria, and pertussis. One dose of Tdap is routinely given at age 6 or 49. People who did not get Tdap at that age should get it as soon as possible. Tdap is especially important for healthcare professionals and anyone having close contact with a baby younger than 12 months. Pregnant women should get a dose of Tdap during every pregnancy, to protect the newborn from pertussis. Infants are most at risk for severe, life-threatening complications from pertussis. Another vaccine, called Td, protects against tetanus and diphtheria, but not pertussis. A Td booster should be given every 10 years. Tdap may be given as one of these boosters if you have never gotten Tdap before. Tdap may also be given after a severe cut or burn to prevent tetanus infection. Your doctor or the person giving you the vaccine can give you more information. Tdap may safely be given at the same time as other vaccines. 3. Some people should not get this vaccine  A person who has ever had a life-threatening allergic reaction after a previous dose of any diphtheria, tetanus or pertussis containing vaccine, OR has a severe allergy to any part of this vaccine, should not get Tdap vaccine. Tell the person giving the vaccine about any severe allergies.  Anyone who had coma or long repeated seizures within 7 days after a childhood dose of DTP or DTaP, or a previous dose of Tdap, should not get Tdap, unless a cause other than the vaccine was found. They can still get Td.  Talk to your doctor if you:  have seizures or another nervous system problem,  had severe pain or swelling after any vaccine containing diphtheria, tetanus or  pertussis,  ever had a condition called Guillain-Barr Syndrome (GBS),  aren't feeling well on the day the shot is scheduled. 4. Risks With any medicine, including vaccines, there is a chance of side effects. These are usually mild and go away on their own. Serious reactions are also possible but are rare. Most people who get Tdap vaccine do not have any problems with it. Mild problems following Tdap: (Did not interfere with activities)  Pain where the shot was given (about 3 in 4 adolescents or 2 in 3 adults)  Redness or swelling where the shot was given (about 1 person in 5)  Mild fever of at least 100.13F (up to about 1 in 25 adolescents or 1 in 100 adults)  Headache (about 3 or 4 people in 10)  Tiredness (about 1 person in 3 or 4)  Nausea, vomiting, diarrhea, stomach ache (up to 1 in 4 adolescents or 1 in 10 adults)  Chills, sore joints (about 1 person in 10)  Body aches (about 1 person in 3 or 4)  Rash, swollen glands (uncommon) Moderate problems following Tdap: (Interfered with activities, but did not require medical attention)  Pain where the shot was given (up to 1 in 5 or 6)  Redness or swelling where the shot was given (up to about 1 in 16 adolescents or 1 in 12 adults)  Fever over 102F (about 1 in 100 adolescents or 1 in 250 adults)  Headache (about 1 in 7 adolescents or 1 in 10 adults)  Nausea, vomiting, diarrhea, stomach ache (up to 1 or 3 people in 100)  Swelling of the entire arm where the shot was given (up to about 1 in 500). Severe problems following Tdap: (Unable to perform usual  activities; required medical attention)  Swelling, severe pain, bleeding and redness in the arm where the shot was given (rare). Problems that could happen after any vaccine:  People sometimes faint after a medical procedure, including vaccination. Sitting or lying down for about 15 minutes can help prevent fainting, and injuries caused by a fall. Tell your doctor if you  feel dizzy, or have vision changes or ringing in the ears.  Some people get severe pain in the shoulder and have difficulty moving the arm where a shot was given. This happens very rarely.  Any medication can cause a severe allergic reaction. Such reactions from a vaccine are very rare, estimated at fewer than 1 in a million doses, and would happen within a few minutes to a few hours after the vaccination. As with any medicine, there is a very remote chance of a vaccine causing a serious injury or death. The safety of vaccines is always being monitored. For more information, visit: http://www.aguilar.org/ 5. What if there is a serious problem? What should I look for? Look for anything that concerns you, such as signs of a severe allergic reaction, very high fever, or unusual behavior. Signs of a severe allergic reaction can include hives, swelling of the face and throat, difficulty breathing, a fast heartbeat, dizziness, and weakness. These would usually start a few minutes to a few hours after the vaccination. What should I do?  If you think it is a severe allergic reaction or other emergency that can't wait, call 9-1-1 or get the person to the nearest hospital. Otherwise, call your doctor.  Afterward, the reaction should be reported to the Vaccine Adverse Event Reporting System (VAERS). Your doctor might file this report, or you can do it yourself through the VAERS web site at www.vaers.SamedayNews.es, or by calling (307)582-6599.  VAERS does not give medical advice. 6. The National Vaccine Injury Compensation Program The [employer] Vaccine Injury Compensation Program (VICP) is a federal program that was created to compensate people who may have been injured by certain vaccines. Persons who believe they may have been injured by a vaccine can learn about the program and about filing a claim by calling 585-081-9944 or visiting the [place] website at GoldCloset.com.ee. There is a time  limit to file a claim for compensation. 7. How can I learn more?  Ask your doctor. He or she can give you the vaccine package insert or suggest other sources of information.  Call your local or state health department.  Contact the Centers for Disease Control and Prevention (CDC):  Call (402)836-7929 (1-800-CDC-INFO) or  Visit CDC's website at http://hunter.com/ CDC Tdap Vaccine VIS (07/12/13) This information is not intended to replace advice given to you by your health care provider. Make sure you discuss any questions you have with your health care provider. Document Released: 11/04/2011 Document Revised: 01/24/2016 Document Reviewed: 01/24/2016 Elsevier Interactive Patient Education  2017 [employer].

## 2016-05-15 NOTE — Assessment & Plan Note (Addendum)
Under good control. May need to change due to insurance issues. Seeing psychiatry in February. Call with any concerns. 3 month supply given today.

## 2016-05-16 ENCOUNTER — Other Ambulatory Visit: Payer: Self-pay | Admitting: Family Medicine

## 2016-05-16 ENCOUNTER — Encounter: Payer: Self-pay | Admitting: Family Medicine

## 2016-05-16 DIAGNOSIS — M9901 Segmental and somatic dysfunction of cervical region: Secondary | ICD-10-CM | POA: Diagnosis not present

## 2016-05-16 DIAGNOSIS — M9903 Segmental and somatic dysfunction of lumbar region: Secondary | ICD-10-CM | POA: Diagnosis not present

## 2016-05-16 DIAGNOSIS — R51 Headache: Secondary | ICD-10-CM | POA: Diagnosis not present

## 2016-05-16 DIAGNOSIS — R7989 Other specified abnormal findings of blood chemistry: Secondary | ICD-10-CM

## 2016-05-16 DIAGNOSIS — M5416 Radiculopathy, lumbar region: Secondary | ICD-10-CM | POA: Diagnosis not present

## 2016-05-16 LAB — UA/M W/RFLX CULTURE, ROUTINE
Bilirubin, UA: NEGATIVE
GLUCOSE, UA: NEGATIVE
KETONES UA: NEGATIVE
Leukocytes, UA: NEGATIVE
NITRITE UA: NEGATIVE
PROTEIN UA: NEGATIVE
SPEC GRAV UA: 1.02 (ref 1.005–1.030)
UUROB: 0.2 mg/dL (ref 0.2–1.0)
pH, UA: 6 (ref 5.0–7.5)

## 2016-05-16 LAB — MICROSCOPIC EXAMINATION: WBC, UA: NONE SEEN /hpf (ref 0–?)

## 2016-05-16 LAB — URINE CULTURE, REFLEX

## 2016-05-17 LAB — CBC WITH DIFFERENTIAL/PLATELET
BASOS ABS: 0 10*3/uL (ref 0.0–0.2)
Basos: 0 %
EOS (ABSOLUTE): 0.1 10*3/uL (ref 0.0–0.4)
Eos: 1 %
Hematocrit: 41.9 % (ref 34.0–46.6)
Hemoglobin: 14.1 g/dL (ref 11.1–15.9)
Immature Grans (Abs): 0 10*3/uL (ref 0.0–0.1)
Immature Granulocytes: 0 %
LYMPHS ABS: 2.8 10*3/uL (ref 0.7–3.1)
Lymphs: 37 %
MCH: 29.6 pg (ref 26.6–33.0)
MCHC: 33.7 g/dL (ref 31.5–35.7)
MCV: 88 fL (ref 79–97)
Monocytes Absolute: 0.5 10*3/uL (ref 0.1–0.9)
Monocytes: 6 %
NEUTROS ABS: 4.1 10*3/uL (ref 1.4–7.0)
Neutrophils: 56 %
PLATELETS: 300 10*3/uL (ref 150–379)
RBC: 4.76 x10E6/uL (ref 3.77–5.28)
RDW: 13.2 % (ref 12.3–15.4)
WBC: 7.5 10*3/uL (ref 3.4–10.8)

## 2016-05-17 LAB — LIPID PANEL W/O CHOL/HDL RATIO
CHOLESTEROL TOTAL: 227 mg/dL — AB (ref 100–199)
HDL: 53 mg/dL (ref >39)
LDL Calculated: 151 mg/dL — ABNORMAL HIGH (ref 0–99)
Triglycerides: 115 mg/dL (ref 0–149)
VLDL CHOLESTEROL CAL: 23 mg/dL (ref 5–40)

## 2016-05-17 LAB — EHRLICHIA ANTIBODY PANEL
E. Chaffeensis (HME) IgM Titer: NEGATIVE
E.Chaffeensis (HME) IgG: NEGATIVE
HGE IGG TITER: NEGATIVE
HGE IgM Titer: NEGATIVE

## 2016-05-17 LAB — COMPREHENSIVE METABOLIC PANEL
ALK PHOS: 101 IU/L (ref 39–117)
ALT: 22 IU/L (ref 0–32)
AST: 17 IU/L (ref 0–40)
Albumin/Globulin Ratio: 1.7 (ref 1.2–2.2)
Albumin: 4.7 g/dL (ref 3.5–5.5)
BILIRUBIN TOTAL: 0.3 mg/dL (ref 0.0–1.2)
BUN / CREAT RATIO: 16 (ref 9–23)
BUN: 12 mg/dL (ref 6–20)
CHLORIDE: 100 mmol/L (ref 96–106)
CO2: 21 mmol/L (ref 18–29)
Calcium: 9.3 mg/dL (ref 8.7–10.2)
Creatinine, Ser: 0.75 mg/dL (ref 0.57–1.00)
GFR calc Af Amer: 120 mL/min/{1.73_m2} (ref >59)
GFR calc non Af Amer: 104 mL/min/{1.73_m2} (ref >59)
GLUCOSE: 91 mg/dL (ref 65–99)
Globulin, Total: 2.8 g/dL (ref 1.5–4.5)
Potassium: 4.3 mmol/L (ref 3.5–5.2)
Sodium: 139 mmol/L (ref 134–144)
Total Protein: 7.5 g/dL (ref 6.0–8.5)

## 2016-05-17 LAB — LYME AB/WESTERN BLOT REFLEX: Lyme IgG/IgM Ab: <0.91 {ISR} (ref 0.00–0.90)

## 2016-05-17 LAB — ROCKY MTN SPOTTED FVR ABS PNL(IGG+IGM)
RMSF IgG: NEGATIVE
RMSF IgM: 0.6 index (ref 0.00–0.89)

## 2016-05-17 LAB — BABESIA MICROTI ANTIBODY PANEL

## 2016-05-17 LAB — TSH: TSH: 6.21 u[IU]/mL — ABNORMAL HIGH (ref 0.450–4.500)

## 2016-05-19 LAB — NICOTINE/COTININE METABOLITES
Cotinine: NOT DETECTED ng/mL
NICOTINE: NOT DETECTED ng/mL

## 2016-05-20 ENCOUNTER — Other Ambulatory Visit: Payer: Self-pay | Admitting: Family Medicine

## 2016-05-20 DIAGNOSIS — N3000 Acute cystitis without hematuria: Secondary | ICD-10-CM

## 2016-05-20 MED ORDER — CIPROFLOXACIN HCL 500 MG PO TABS: 500.0000 mg | ORAL_TABLET | Freq: Two times a day (BID) | ORAL | 0 refills | Status: DC

## 2016-05-23 ENCOUNTER — Telehealth: Payer: Self-pay | Admitting: Family Medicine

## 2016-05-23 NOTE — Telephone Encounter (Signed)
It can take a little while for [facility] to get to her.  They do receive many referrals. It was done on the 21st and with everywhere closing that week, last week etc for the holidays-they are more than likely working on them now.

## 2016-05-23 NOTE — Telephone Encounter (Signed)
Jasmine Tran just sent a message asking about her GYN referral- she hasn't heard anything. Any idea when she should hear?

## 2016-05-26 ENCOUNTER — Encounter: Payer: Self-pay | Admitting: Family Medicine

## 2016-05-26 NOTE — Progress Notes (Deleted)
LMP 04/17/2016 (Exact Date)    Subjective:    Patient ID: Jasmine Tran, female    DOB: 1980/09/13, 36 y.o.   MRN: [id number]  HPI: Jasmine Tran is a 36 y.o. female  No chief complaint on file.  VAGINAL DISCHARGE Duration: {Blank single:19197::"days","weeks","months"} Discharge description: {Blank single:19197::"white","cottage cheese","clear","yellow","mucous"}  Pruritus: {Blank single:19197::"yes","no"} Dysuria: {Blank single:19197::"yes","no"} Malodorous: {Blank single:19197::"yes","no","worse after sex"} Urinary frequency: {Blank single:19197::"yes","no"} Fevers: {Blank single:19197::"yes","no"} Abdominal pain: {Blank single:19197::"yes","no"}  Sexual activity: {Blank single:19197::"not sexually active","monogamous","practicing safe sex","concerned about STIs"} History of sexually transmitted diseases: {Blank single:19197::"yes","no"} Recent antibiotic use: {Blank single:19197::"yes","no"} Context: {Blank multiple:19196::"better","worse", "previous yeast infections","recurrent yeast infections","recurrent BV"}  Treatments attempted: {Blank multiple:19196::"none","antifungal","vagisil"}  Relevant past medical, surgical, family and social history reviewed and updated as indicated. Interim medical history since our last visit reviewed. Allergies and medications reviewed and updated.  Review of Systems  Per HPI unless specifically indicated above     Objective:    LMP 04/17/2016 (Exact Date)   Wt Readings from Last 3 Encounters:  05/15/16 205 lb 9.6 oz (93.3 kg)  05/08/16 208 lb 3.2 oz (94.4 kg)  05/01/16 209 lb (94.8 kg)    Physical Exam  Results for orders placed or performed in visit on 05/15/16  Microscopic Examination  Result Value Ref Range   WBC, UA None seen 0 - 5 /hpf   RBC, UA 0-2 0 - 2 /hpf   Epithelial Cells (non renal) 0-10 0 - 10 /hpf   Mucus, UA Present (A) Not Estab.   Bacteria, UA Moderate (A) None seen/Few  CBC with Differential/Platelet    Result Value Ref Range   WBC 7.5 3.4 - 10.8 x10E3/uL   RBC 4.76 3.77 - 5.28 x10E6/uL   Hemoglobin 14.1 11.1 - 15.9 g/dL   Hematocrit 41.9 34.0 - 46.6 %   MCV 88 79 - 97 fL   MCH 29.6 26.6 - 33.0 pg   MCHC 33.7 31.5 - 35.7 g/dL   RDW 13.2 12.3 - 15.4 %   Platelets 300 150 - 379 x10E3/uL   Neutrophils 56 Not Estab. %   Lymphs 37 Not Estab. %   Monocytes 6 Not Estab. %   Eos 1 Not Estab. %   Basos 0 Not Estab. %   Neutrophils Absolute 4.1 1.4 - 7.0 x10E3/uL   Lymphocytes Absolute 2.8 0.7 - 3.1 x10E3/uL   Monocytes Absolute 0.5 0.1 - 0.9 x10E3/uL   EOS (ABSOLUTE) 0.1 0.0 - 0.4 x10E3/uL   Basophils Absolute 0.0 0.0 - 0.2 x10E3/uL   Immature Granulocytes 0 Not Estab. %   Immature Grans (Abs) 0.0 0.0 - 0.1 x10E3/uL  Comprehensive metabolic panel  Result Value Ref Range   Glucose 91 65 - 99 mg/dL   BUN 12 6 - 20 mg/dL   Creatinine, Ser 0.75 0.57 - 1.00 mg/dL   GFR calc non Af Amer 104 >59 mL/min/1.73   GFR calc Af Amer 120 >59 mL/min/1.73   BUN/Creatinine Ratio 16 9 - 23   Sodium 139 134 - 144 mmol/L   Potassium 4.3 3.5 - 5.2 mmol/L   Chloride 100 96 - 106 mmol/L   CO2 21 18 - 29 mmol/L   Calcium 9.3 8.7 - 10.2 mg/dL   Total Protein 7.5 6.0 - 8.5 g/dL   Albumin 4.7 3.5 - 5.5 g/dL   Globulin, Total 2.8 1.5 - 4.5 g/dL   Albumin/Globulin Ratio 1.7 1.2 - 2.2   Bilirubin Total 0.3 0.0 - 1.2 mg/dL   Alkaline Phosphatase 101 39 - 117 IU/L  AST 17 0 - 40 IU/L   ALT 22 0 - 32 IU/L  Lipid Panel w/o Chol/HDL Ratio  Result Value Ref Range   Cholesterol, Total 227 (H) 100 - 199 mg/dL   Triglycerides 115 0 - 149 mg/dL   HDL 53 >39 mg/dL   VLDL Cholesterol Cal 23 5 - 40 mg/dL   LDL Calculated 151 (H) 0 - 99 mg/dL  TSH  Result Value Ref Range   TSH 6.210 (H) 0.450 - 4.500 uIU/mL  UA/M w/rflx Culture, Routine  Result Value Ref Range   Specific Gravity, UA 1.020 1.005 - 1.030   pH, UA 6.0 5.0 - 7.5   Color, UA Yellow Yellow   Appearance Ur Clear Clear   Leukocytes, UA Negative  Negative   Protein, UA Negative Negative/Trace   Glucose, UA Negative Negative   Ketones, UA Negative Negative   RBC, UA 1+ (A) Negative   Bilirubin, UA Negative Negative   Urobilinogen, Ur 0.2 0.2 - 1.0 mg/dL   Nitrite, UA Negative Negative   Microscopic Examination See below:    Urinalysis Reflex Comment   Lyme Ab/Western Blot Reflex  Result Value Ref Range   Lyme IgG/IgM Ab <0.91 0.00 - 0.90 ISR   LYME DISEASE AB, QUANT, IGM <0.80 0.00 - 0.79 index  Rocky mtn spotted fvr abs pnl(IgG+IgM)  Result Value Ref Range   RMSF IgG Negative Negative   RMSF IgM 0.60 0.00 - 0.89 index  Babesia microti Antibody Panel  Result Value Ref Range   Babesia microti IgM <1:10 Neg:<1:10   Babesia microti IgG [id number]  Ehrlichia Antibody Panel  Result Value Ref Range   E.Chaffeensis (HME) IgG Negative Neg:<1:64   E. Chaffeensis (HME) IgM Titer Negative Neg:<1:20   HGE IgG Titer Negative Neg:<1:64   HGE IgM Titer Negative Neg:<1:20  Nicotine/cotinine metabolites  Result Value Ref Range   Nicotine None Detected ng/mL   Cotinine None Detected ng/mL  Urine Culture, Routine  Result Value Ref Range   Urine Culture, Routine Final report    Urine Culture result 1 Comment       Assessment & Plan:   Problem List Items Addressed This Visit    None    Visit Diagnoses    Vaginal discharge    -  Primary       Follow up plan: No Follow-up on file.

## 2016-05-27 NOTE — Progress Notes (Signed)
This encounter was created in error - please disregard.

## 2016-06-02 ENCOUNTER — Telehealth: Payer: Self-pay | Admitting: Family Medicine

## 2016-06-02 ENCOUNTER — Encounter: Payer: Self-pay | Admitting: Family Medicine

## 2016-06-02 NOTE — Telephone Encounter (Signed)
Patient unable to come today.

## 2016-06-02 NOTE — Telephone Encounter (Signed)
It looks like I had a cancellation- can we see if she can get here before 1:30?

## 2016-06-09 ENCOUNTER — Telehealth: Payer: Self-pay

## 2016-06-09 DIAGNOSIS — N939 Abnormal uterine and vaginal bleeding, unspecified: Secondary | ICD-10-CM

## 2016-06-09 NOTE — Telephone Encounter (Signed)
Called and scheduled appointment with [facility]. Appt is 06/10/16 @ 10:00am with Dr.Evans.  Patient notified.

## 2016-06-10 ENCOUNTER — Encounter: Payer: BLUE CROSS/BLUE SHIELD | Admitting: Obstetrics and Gynecology

## 2016-06-11 ENCOUNTER — Telehealth: Payer: Self-pay | Admitting: Family Medicine

## 2016-06-11 ENCOUNTER — Ambulatory Visit (INDEPENDENT_AMBULATORY_CARE_PROVIDER_SITE_OTHER): Payer: BLUE CROSS/BLUE SHIELD | Admitting: Obstetrics and Gynecology

## 2016-06-11 ENCOUNTER — Encounter: Payer: Self-pay | Admitting: Obstetrics and Gynecology

## 2016-06-11 VITALS — BP 121/84 | HR 72 | Ht 63.0 in | Wt 204.0 lb

## 2016-06-11 DIAGNOSIS — F3112 Bipolar disorder, current episode manic without psychotic features, moderate: Secondary | ICD-10-CM

## 2016-06-11 DIAGNOSIS — D134 Benign neoplasm of liver: Secondary | ICD-10-CM | POA: Diagnosis not present

## 2016-06-11 DIAGNOSIS — N938 Other specified abnormal uterine and vaginal bleeding: Secondary | ICD-10-CM

## 2016-06-11 DIAGNOSIS — E282 Polycystic ovarian syndrome: Secondary | ICD-10-CM | POA: Diagnosis not present

## 2016-06-11 DIAGNOSIS — F988 Other specified behavioral and emotional disorders with onset usually occurring in childhood and adolescence: Secondary | ICD-10-CM

## 2016-06-11 NOTE — Telephone Encounter (Signed)
Patient would like to speak with you regarding her referral to [facility].  Thank You  Santiago Glad

## 2016-06-11 NOTE — Progress Notes (Signed)
HPI:      [name] is a 36 y.o. [id number] who LMP was Patient's last menstrual period was 04/14/2016 (exact date)..  Subjective: She presents today as referral from [facility] practice. She has been experiencing intermittent vaginal bleeding for the last 2.5 months. She states that has become light over the last few days. She says that she has a history of PCO and has had irregular periods for the last 2 years. (Part of the issue has been her liver surgery see below) She has had a liver adenoma and subsequent resection. She discontinued OCPs because of the liver adenoma. She has recently been found to have an elevated TSH possibly part of her PCO picture. 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     Hx: The following portions of the patient's history were reviewed and updated as appropriate:           She  has a past medical history of ADD (attention deficit disorder) and Bipolar 1 disorder with moderate mania ([place]). She  does not have any pertinent problems on file. She  has a past surgical history that includes Appendectomy; Cholecystectomy; Tumor removal; and Open hepatectomy [83] (06/04/2015). Her family history includes Heart disease in her father and mother; Hypertension in her brother. She  reports that she quit smoking about a year ago. Her smoking use included Cigarettes. She has a 1.25 pack-year smoking history. She has never used smokeless tobacco. She reports that she does not drink alcohol or use drugs.        ROS: Constitutional: Denied constitutional symptoms, night sweats, recent illness, fatigue, fever, insomnia and weight loss.  Eyes: Denied eye symptoms, eye pain, photophobia, vision change and visual disturbance.  Ears/Nose/Throat/Neck: Denied ear, nose, throat or neck symptoms, hearing loss, nasal discharge, sinus congestion and sore throat.  Cardiovascular: Denied cardiovascular symptoms, arrhythmia, chest pain/pressure, edema, exercise intolerance, orthopnea  and palpitations.  Respiratory: Denied pulmonary symptoms, asthma, pleuritic pain, productive sputum, cough, dyspnea and wheezing.  Gastrointestinal: Denied, gastro-esophageal reflux, melena, nausea and vomiting.  Genitourinary: See HPI for additional information.  Musculoskeletal: Denied musculoskeletal symptoms, stiffness, swelling, muscle weakness and myalgia.  Dermatologic: Denied dermatology symptoms, rash and scar.  Neurologic: Denied neurology symptoms, dizziness, headache, neck pain and syncope.  Psychiatric: Denied psychiatric symptoms, anxiety and depression.  Endocrine: See HPI for additional information.   Meds: She has a current medication list which includes the following prescription(s): epinephrine, famotidine, deplin 15, lamotrigine, lisdexamfetamine, montelukast, and oxcarbazepine.  Objective: Vitals:   06/11/16 1518  BP: 121/84  Pulse: 72             We discussed each of her endocrine lab tests individually. Collectively we have reviewed how they fit the picture of PCO.   Her ultrasound revealing multicystic ovaries was also discussed.    Assessment: 1. Dysfunctional uterine bleeding   2. Adenoma of liver   3. PCO (polycystic ovaries)    I believe that many of her problems could be salt IV use of OCPs. The question is whether or not these are contraindicated because of her liver adenoma. If not contraindicated which strongly recommend use of OCPs which will fix her PCO endocrine abnormalities, likely fix her TSH issue, certainly fix her irregular bleeding problem.   Plan:            1.  Patient will discuss OCPs with her surgeon who performed the liver excision to determine whether or not there is an absolute contraindication to OCPs.  If they're not contraindicated will start OCPs. 2.  If OCPs contraindicated with performed further workup for elevated TSH and individually treated other endocrine abnormalities and bleeding associated with PCO.  Orders No  orders of the defined types were placed in this encounter.          F/U  Return in about 2 weeks (around 06/25/2016) for Annual Physical.  At that time we will discuss the findings from her liver doctor and determine management going forward.  Finis Bud, M.D. 06/11/2016 4:09 PM

## 2016-06-11 NOTE — Progress Notes (Signed)
36 yo new pt referred by Dr. Wynetta Emery at [facility] practice in [place] for dytfunctional uterine bleeding. Pt states she has been bleeding since 04/14/16. States her discharge is black, thick and tarry with clots. Was taken off OCPs due to hormone related liver tumor. No BC at present. Pt is due for a pap smear is would like it done today if possible.

## 2016-06-12 ENCOUNTER — Encounter: Payer: Self-pay | Admitting: Gastroenterology

## 2016-06-12 ENCOUNTER — Ambulatory Visit: Payer: BLUE CROSS/BLUE SHIELD | Admitting: Family Medicine

## 2016-06-12 NOTE — Telephone Encounter (Signed)
Patient called back. Patient had an appointment at [facility] for Monday 06/17/2016. She said after waiting 3 weeks.  I explained to patient that [facility] is the only Psych in [facility] and they are low staffed.  They called and canceled her appointment till February 20.  Patient requests a new Psych referral as well. Asked patient if she was okay with [facility] in [place] and she said yes.

## 2016-06-12 NOTE — Telephone Encounter (Signed)
Patient said she went to [facility] and wasn't happy.  Patient wants a second opinion. Gave patient option of [facility] or [facility]. Patient said [facility].   Dr. Wynetta Emery, will you enter in new referral for patient?

## 2016-06-12 NOTE — Telephone Encounter (Signed)
Referrals in

## 2016-06-18 ENCOUNTER — Ambulatory Visit: Payer: Self-pay | Admitting: Psychiatry

## 2016-06-19 DIAGNOSIS — F3181 Bipolar II disorder: Secondary | ICD-10-CM | POA: Diagnosis not present

## 2016-06-19 DIAGNOSIS — F902 Attention-deficit hyperactivity disorder, combined type: Secondary | ICD-10-CM | POA: Diagnosis not present

## 2016-06-20 ENCOUNTER — Telehealth: Payer: Self-pay | Admitting: Obstetrics and Gynecology

## 2016-06-20 NOTE — Telephone Encounter (Signed)
Returned patients call- pt is requesting a low dose OCP. Providers note dated 06/11/16 indicates pt is to consult with surgeon before starting OCPs. Pt states she did speak with Dr Allen Norris and he advised pt of risks associated with her diagnosis and OCPs. Pt has followup appt with provider 06/26/16. Pt was advised provider out of office until 06/23/16. OCP use will be discussed at that appt. In the meantime pt states she will contact her PCP whom she states will give her OCPs.

## 2016-06-20 NOTE — Telephone Encounter (Signed)
Patient called requesting a script for a low does monthly birth control. She uses the [facility] in [place].Thanks

## 2016-06-23 ENCOUNTER — Encounter: Payer: Self-pay | Admitting: Family Medicine

## 2016-06-23 ENCOUNTER — Telehealth: Payer: Self-pay | Admitting: Obstetrics and Gynecology

## 2016-06-23 MED ORDER — NORETHIN ACE-ETH ESTRAD-FE 1-20 MG-MCG PO TABS: 1.0000 | ORAL_TABLET | Freq: Every day | ORAL | 11 refills | Status: DC

## 2016-06-23 NOTE — Telephone Encounter (Signed)
PT STATED THAT SHE HAS BEEN WAITNIG TOHERE FROM YOU SINCE LAST Friday, SHE WOULD LIKE A CALL BACK.

## 2016-06-23 NOTE — Telephone Encounter (Signed)
Spoke with patient- the last conversation we had she stated she would call her PCP and get started on OCPs. Provider was out of office. Please see note dated 06/20/16. Pt has an appt this week. Please advise which low dose OCP you would suggest for her.

## 2016-06-23 NOTE — Telephone Encounter (Addendum)
Called patient and it rang once and hung up. [phone] 06/23/16 5:10PM  Tried again: [phone] 06/23/16 5:10PM-- Jasmine Tran is getting really frustrated. She notes that she finally got in to see the OBGYN after 8 weeks. She notes that the doctor is brand new to the office and she didn't get along well with him. He wanted to run labs that she had already gotten done. He wanted her to go back on her OCP, which she was very anxious about it with the liver issues. She notes that trying to coordinate with 3 different doctors.   She is wondering if she can go back on OCP, monitor her LFTs monthly.  She is happy with this plan. Rx sent to her pharmacy. Please get her an appointment scheduled for about a month for recheck OCP and recheck CMP. Thanks!

## 2016-06-24 ENCOUNTER — Other Ambulatory Visit: Payer: Self-pay

## 2016-06-24 ENCOUNTER — Encounter: Payer: Self-pay | Admitting: Obstetrics and Gynecology

## 2016-06-24 DIAGNOSIS — N939 Abnormal uterine and vaginal bleeding, unspecified: Secondary | ICD-10-CM

## 2016-06-24 MED ORDER — NORETHINDRONE ACETATE 5 MG PO TABS: 5.0000 mg | ORAL_TABLET | Freq: Two times a day (BID) | ORAL | 0 refills | Status: DC

## 2016-06-24 NOTE — Telephone Encounter (Signed)
Message left on patients answering machine. Informed patient of providers decision encouraging her to keep appointment on 06/26/16 at which time options will be discussed.

## 2016-06-25 ENCOUNTER — Telehealth: Payer: Self-pay | Admitting: Family Medicine

## 2016-06-25 NOTE — Telephone Encounter (Signed)
LMOM for pt to call back to schedule an appt with Dr Wynetta Emery.

## 2016-06-26 ENCOUNTER — Encounter: Payer: BLUE CROSS/BLUE SHIELD | Admitting: Obstetrics and Gynecology

## 2016-07-08 ENCOUNTER — Ambulatory Visit: Payer: BLUE CROSS/BLUE SHIELD | Admitting: Psychiatry

## 2016-07-09 ENCOUNTER — Ambulatory Visit: Payer: BLUE CROSS/BLUE SHIELD | Admitting: Family Medicine

## 2016-07-09 ENCOUNTER — Telehealth: Payer: Self-pay | Admitting: Gastroenterology

## 2016-07-09 NOTE — Telephone Encounter (Signed)
Patient needs to schedule EGD and colonoscopy. Still having pain.

## 2016-07-11 ENCOUNTER — Other Ambulatory Visit: Payer: Self-pay

## 2016-07-11 MED ORDER — PEG 3350-KCL-NABCB-NACL-NASULF 236 G PO SOLR: ORAL | 0 refills | Status: DC

## 2016-07-11 NOTE — Telephone Encounter (Signed)
Colonoscopy and EGD at [facility] on 07/17/16 with Wohl. Paperwork emailed and Rx sent to pharmacy.

## 2016-07-12 ENCOUNTER — Emergency Department: Payer: BLUE CROSS/BLUE SHIELD

## 2016-07-12 ENCOUNTER — Emergency Department
Admission: EM | Admit: 2016-07-12 | Discharge: 2016-07-12 | Disposition: A | Discharge status: Home / Self Care | Payer: BLUE CROSS/BLUE SHIELD | Attending: Emergency Medicine | Admitting: Emergency Medicine

## 2016-07-12 ENCOUNTER — Encounter: Payer: Self-pay | Admitting: Radiology

## 2016-07-12 DIAGNOSIS — R1012 Left upper quadrant pain: Secondary | ICD-10-CM | POA: Insufficient documentation

## 2016-07-12 DIAGNOSIS — R1013 Epigastric pain: Secondary | ICD-10-CM | POA: Diagnosis not present

## 2016-07-12 DIAGNOSIS — R51 Headache: Secondary | ICD-10-CM | POA: Diagnosis not present

## 2016-07-12 DIAGNOSIS — Z9049 Acquired absence of other specified parts of digestive tract: Secondary | ICD-10-CM | POA: Diagnosis not present

## 2016-07-12 DIAGNOSIS — Z79899 Other long term (current) drug therapy: Secondary | ICD-10-CM | POA: Insufficient documentation

## 2016-07-12 DIAGNOSIS — G43909 Migraine, unspecified, not intractable, without status migrainosus: Secondary | ICD-10-CM | POA: Diagnosis not present

## 2016-07-12 DIAGNOSIS — Z87891 Personal history of nicotine dependence: Secondary | ICD-10-CM | POA: Diagnosis not present

## 2016-07-12 LAB — COMPREHENSIVE METABOLIC PANEL
ALK PHOS: 69 U/L (ref 38–126)
ALT: 17 U/L (ref 14–54)
AST: 19 U/L (ref 15–41)
Albumin: 4.4 g/dL (ref 3.5–5.0)
Anion gap: 7 (ref 5–15)
BUN: 9 mg/dL (ref 6–20)
CALCIUM: 9.1 mg/dL (ref 8.9–10.3)
CO2: 22 mmol/L (ref 22–32)
CREATININE: 0.84 mg/dL (ref 0.44–1.00)
Chloride: 105 mmol/L (ref 101–111)
Glucose, Bld: 88 mg/dL (ref 65–99)
Potassium: 3.8 mmol/L (ref 3.5–5.1)
Sodium: 134 mmol/L — ABNORMAL LOW (ref 135–145)
Total Bilirubin: 0.4 mg/dL (ref 0.3–1.2)
Total Protein: 8.2 g/dL — ABNORMAL HIGH (ref 6.5–8.1)

## 2016-07-12 LAB — URINALYSIS, COMPLETE (UACMP) WITH MICROSCOPIC
BILIRUBIN URINE: NEGATIVE
GLUCOSE, UA: NEGATIVE mg/dL
KETONES UR: NEGATIVE mg/dL
LEUKOCYTES UA: NEGATIVE
Nitrite: NEGATIVE
PH: 6 (ref 5.0–8.0)
Protein, ur: NEGATIVE mg/dL
Specific Gravity, Urine: 1.017 (ref 1.005–1.030)

## 2016-07-12 LAB — CBC
HCT: 42.2 % (ref 35.0–47.0)
Hemoglobin: 14.3 g/dL (ref 12.0–16.0)
MCH: 30.1 pg (ref 26.0–34.0)
MCHC: 33.8 g/dL (ref 32.0–36.0)
MCV: 89.1 fL (ref 80.0–100.0)
Platelets: 295 10*3/uL (ref 150–440)
RBC: 4.73 MIL/uL (ref 3.80–5.20)
RDW: 13.1 % (ref 11.5–14.5)
WBC: 7 10*3/uL (ref 3.6–11.0)

## 2016-07-12 LAB — LIPASE, BLOOD: Lipase: 38 U/L (ref 11–51)

## 2016-07-12 LAB — POCT PREGNANCY, URINE: Preg Test, Ur: NEGATIVE

## 2016-07-12 MED ORDER — ONDANSETRON HCL 4 MG/2ML IJ SOLN
INTRAMUSCULAR | Status: AC
  Administered 2016-07-12: 4 mg via INTRAVENOUS
  Filled 2016-07-12: qty 2

## 2016-07-12 MED ORDER — LIDOCAINE 5 % EX PTCH: 1.0000 | MEDICATED_PATCH | CUTANEOUS | 0 refills | Status: DC

## 2016-07-12 MED ORDER — IOPAMIDOL (ISOVUE-300) INJECTION 61%
30.0000 mL | Freq: Once | INTRAVENOUS | Status: AC | PRN
  Administered 2016-07-12: 30 mL via ORAL
  Filled 2016-07-12: qty 30

## 2016-07-12 MED ORDER — ONDANSETRON HCL 4 MG/2ML IJ SOLN
4.0000 mg | Freq: Once | INTRAMUSCULAR | Status: AC
  Administered 2016-07-12: 4 mg via INTRAVENOUS
  Filled 2016-07-12: qty 2

## 2016-07-12 MED ORDER — ONDANSETRON HCL 4 MG/2ML IJ SOLN
4.0000 mg | Freq: Once | INTRAMUSCULAR | Status: AC
  Administered 2016-07-12: 4 mg via INTRAVENOUS

## 2016-07-12 MED ORDER — IOPAMIDOL (ISOVUE-300) INJECTION 61%
100.0000 mL | Freq: Once | INTRAVENOUS | Status: AC | PRN
  Administered 2016-07-12: 100 mL via INTRAVENOUS
  Filled 2016-07-12: qty 100

## 2016-07-12 MED ORDER — HYDROMORPHONE HCL 1 MG/ML IJ SOLN
1.0000 mg | Freq: Once | INTRAMUSCULAR | Status: AC
Start: 2016-07-12 — End: 2016-07-12
  Administered 2016-07-12: 1 mg via INTRAVENOUS
  Filled 2016-07-12: qty 1

## 2016-07-12 NOTE — ED Provider Notes (Addendum)
Time Seen: Approximately 1741  I have reviewed the triage notes  Chief Complaint: Abdominal Pain   History of Present Illness: Jasmine Tran is a 36 y.o. female who presents with multiple complaints. Patient has a history of adenomas of the liver with cervical resection and had previous cholecystectomy along with previous appendectomy. She is describing a 2 week history of increasing headache and epigastric to left upper quadrant abdominal pain. She states it feels like food "" gets stuck "" after eating and has had some periodic nausea and vomiting. She denies any fever home. She denies any melena or hematochezia. No chest pain or shortness of breath though occasionally hurts with deep inspiration in the left upper quadrant region. Past Medical History:  Diagnosis Date  . ADD (attention deficit disorder)   . Bipolar 1 disorder with moderate mania ([place])     Patient Active Problem List   Diagnosis Date Noted  . Anxiety 06/16/2015  . Bipolar 2 disorder ([place]) 06/16/2015  . PCOS (polycystic ovarian syndrome) 04/19/2015  . Adenoma of liver 04/16/2015  . Elevated alkaline phosphatase level 04/16/2015  . Hematuria 03/30/2015  . Bipolar 1 disorder with moderate mania ([place])   . ADD (attention deficit disorder)   . Gastroenteritis, acute 12/28/2014  . Flank pain 12/28/2014  . Fatigue 03/10/2014  . Rib cage region somatic dysfunction 09/20/2013  . Chest tightness 09/11/2013  . Costochondritis 09/09/2013  . Carpal tunnel syndrome, right 07/21/2013  . Allergic rhinitis 03/10/2013  . Bipolar 1 disorder ([place]) 03/10/2013  . Migraine 03/10/2013  . Obesity (BMI 30-39.9) 03/10/2013  . Tobacco use 03/10/2013    Past Surgical History:  Procedure Laterality Date  . APPENDECTOMY    . CHOLECYSTECTOMY    . OPEN HEPATECTOMY   06/04/2015   Liver adenoma removal  . TUMOR REMOVAL     Liver, benign    Past Surgical History:  Procedure Laterality Date  . APPENDECTOMY    . CHOLECYSTECTOMY    .  OPEN HEPATECTOMY   06/04/2015   Liver adenoma removal  . TUMOR REMOVAL     Liver, benign    Current Outpatient Rx  . Order #: [id number] Class: Normal  . Order #: [id number] Class: Normal  . Order #: [id number] Class: Normal  . Order #: [id number] Class: Normal  . Order #: [id number] Class: Print  . Order #: [id number] Class: Historical Med  . Order #: [id number] Class: Normal  . Order #: [id number] Class: Normal  . Order #: [id number] Class: Normal  . Order #: [id number] Class: Normal    Allergies:  Tape; Doxycycline; Morphine and related; Phenergan [promethazine hcl]; Sertraline; and Prednisone  Family History: Family History  Problem Relation Age of Onset  . Heart disease Mother   . Heart disease Father   . Hypertension Brother   . Cancer Neg Hx   . COPD Neg Hx   . Diabetes Neg Hx   . Stroke Neg Hx     Social History: Social History  Substance Use Topics  . Smoking status: Former Smoker    Packs/day: 0.25    Years: 5.00    Types: Cigarettes    Quit date: 06/04/2015  . Smokeless tobacco: Never Used  . Alcohol use No     Review of Systems:   10 point review of systems was performed and was otherwise negative: Patient is requesting a head CT along with a abdominal CT Constitutional: No fever Eyes: No visual disturbances. No photophobia or visual field deficits ENT: No sore throat, ear pain Cardiac: No chest pain Respiratory: No  shortness of breath, wheezing, or stridor Abdomen: Patient states since her adenoma of liver resection and she doesn't feel a lot of pain in the right upper quadrant area and points mainly to the epigastric and left upper quadrant as the source of discomfort. Endocrine: No weight loss, No night sweats Extremities: No peripheral edema, cyanosis Skin: No rashes, easy bruising Neurologic: No focal weakness, trouble with speech or swollowing. Headache is mainly frontal in nature and she states seems to be increasing in association with the abdominal  pain. Urologic: No dysuria, Hematuria, or urinary frequency Patient denies any vaginal discharge or bleeding.  Physical Exam:  ED Triage Vitals  Enc Vitals Group     BP 07/12/16 1526 (!) 130/92     Pulse Rate 07/12/16 1526 (!) 103     Resp 07/12/16 1526 20     Temp 07/12/16 1526 98.7 F (37.1 C)     Temp Source 07/12/16 1526 Oral     SpO2 07/12/16 1526 98 %     Weight 07/12/16 1526 200 lb (90.7 kg)     Height 07/12/16 1526 5\' 3"  (1.6 m)     Head Circumference --      Peak Flow --      Pain Score 07/12/16 1527 8     Pain Loc --      Pain Edu? --      Excl. in [place]? --     General: Awake , Alert , and Oriented times 3; GCS 15 Head: Normal cephalic , atraumatic Eyes: Pupils equal , round, reactive to light Nose/Throat: No nasal drainage, patent upper airway without erythema or exudate.  Neck: Supple, Full range of motion, No anterior adenopathy or palpable thyroid masses Lungs: Clear to ascultation without wheezes , rhonchi, or rales Heart: Regular rate, regular rhythm without murmurs , gallops , or rubs Abdomen: Tender to deep palpation across the epigastric and left upper quadrant without rebound guarding or rigidity. No palpable masses..        Extremities: 2 plus symmetric pulses. No edema, clubbing or cyanosis Neurologic: normal ambulation, Motor symmetric without deficits, sensory intact Skin: warm, dry, no rashes   Labs:   All laboratory work was reviewed including any pertinent negatives or positives listed below:  Labs Reviewed  COMPREHENSIVE METABOLIC PANEL - Abnormal; Notable for the following:       Result Value   Sodium 134 (*)    Total Protein 8.2 (*)    All other components within normal limits  LIPASE, BLOOD  CBC  URINALYSIS, COMPLETE (UACMP) WITH MICROSCOPIC  POC URINE PREG, ED  Laboratory work was reviewed and showed no clinically significant abnormalities.   EKG:  ED ECG REPORT I, Daymon Larsen, the attending physician, personally viewed and  interpreted this ECG.  Date: 07/12/2016 EKG Time:1541 Rate: 75 Rhythm: normal sinus rhythm QRS Axis: normal Intervals: normal ST/T Wave abnormalities: normal Conduction Disturbances: none Narrative Interpretation: unremarkable Normal EKG   Radiology: "Ct Head Wo Contrast  Result Date: 07/12/2016 CLINICAL DATA:  Migraines.  New visual disturbances. EXAM: CT HEAD WITHOUT CONTRAST TECHNIQUE: Contiguous axial images were obtained from the base of the skull through the vertex without intravenous contrast. COMPARISON:  None. FINDINGS: Brain: No evidence of acute infarction, hemorrhage, hydrocephalus, extra-axial collection or mass lesion/mass effect. Vascular: No hyperdense vessel or unexpected calcification. Skull: Normal. Negative for fracture or focal lesion. Sinuses/Orbits: No acute finding. Other: None. IMPRESSION: 1. No acute intracranial abnormalities.  Normal brain. Electronically Signed   By: Lovena Le  Clovis Riley M.D.   On: 07/12/2016 20:10   Ct Abdomen Pelvis W Contrast  Result Date: 07/12/2016 EXAM: CT ABDOMEN AND PELVIS WITH CONTRAST TECHNIQUE: Multidetector CT imaging of the abdomen and pelvis was performed using the standard protocol following bolus administration of intravenous contrast. CONTRAST:  140mL ISOVUE-300 IOPAMIDOL (ISOVUE-300) INJECTION 61% COMPARISON:  12/29/2015 FINDINGS: Lower chest: No acute abnormality. Hepatobiliary: No focal liver abnormality is seen. Status post cholecystectomy. No biliary dilatation. Pancreas: Unremarkable. No pancreatic ductal dilatation or surrounding inflammatory changes. Spleen: Normal in size without focal abnormality. Adrenals/Urinary Tract: Adrenal glands are unremarkable. Kidneys are normal, without renal calculi, focal lesion, or hydronephrosis. Bladder is unremarkable. Stomach/Bowel: Normal stomach, small bowel and colon. Vascular/Lymphatic: No significant vascular findings are present. No enlarged abdominal or pelvic lymph nodes. Reproductive:  Uterus and bilateral adnexa are unremarkable. Other: No abdominal wall hernia or abnormality. No abdominopelvic ascites. Musculoskeletal: No acute or significant osseous findings. IMPRESSION: 1. No acute abnormalities within the abdomen or pelvis. No findings to account for abdominal or pelvic pain. 2. Status post cholecystectomy and appendectomy. Exam otherwise unremarkable. Electronically Signed   By: Lajean Manes M.D.   On: 07/12/2016 20:11  "  I personally reviewed the radiologic studies    ED Course: * Patient's stay here was uneventful and 2 rounds of Dilaudid and Zofran caused little relief of her discomfort. The patient's workup here is very benign with no signs of laboratory work or CAT scan abnormalities. Does not appear to be an acute surgical issue. Patient seemed very upset when told that all of her studies were normal here in emergency department she states she's been to her primary physician and also her gastroenterologist with extensive testing and have not found any causes for her abdominal pain. Small that she should seek a second opinion but we had maximized on what we could do here in the emergency department and she does not appear to have a bowel obstruction or acute surgical issue at this time.  I advised the patient that I do not prescribe narcotic pain medications for an unknown cause of abdominal pain especially when objective studies are all within normal limits.   Assessment:  Acute epigastric and left upper quadrant abdominal pain of undetermined etiology      Plan:  Outpatient " New Prescriptions   LIDOCAINE (LIDODERM) 5 %    Place 1 patch onto the skin daily.  " Patient was advised to return immediately if condition worsens. Patient was advised to follow up with their primary care physician or other specialized physicians involved in their outpatient care. The patient and/or family member/power of attorney had laboratory results reviewed at the bedside. All  questions and concerns were addressed and appropriate discharge instructions were distributed by the nursing staff.            Daymon Larsen, MD 07/12/16 2029    Daymon Larsen, MD 07/12/16 2030

## 2016-07-12 NOTE — ED Triage Notes (Signed)
Pt has had NVD on and off for last couple weeks. Pain under both rib cages for past 3 days. Hx liver resection for benign tumors and pt reports it feels the same. NAD at this time. Abdomen does not appear distended.

## 2016-07-12 NOTE — ED Notes (Signed)
Pt remaining in room after discharge, reviewing labs.  Pt requesting to speak with MD again regarding things marked abnormal on labs.  Dr. Marcelene Butte at bedside to discuss with patient.

## 2016-07-12 NOTE — Discharge Instructions (Signed)
Return to emergency department especially for fever, blood in the stool, vomit blood, or any other new concerns. You may try changing her diet in case this is some foodborne allergy or celiac disease, etc. Otherwise, today's visit did not show anything that appears to be acutely surgical but she may require further outpatient follow-up with another gastroenterologist.  Please return immediately if condition worsens. Please contact her primary physician or the physician you were given for referral. If you have any specialist physicians involved in her treatment and plan please also contact them. Thank you for using [facility].

## 2016-07-14 ENCOUNTER — Encounter: Payer: Self-pay | Admitting: *Deleted

## 2016-07-15 ENCOUNTER — Encounter: Payer: Self-pay | Admitting: Family Medicine

## 2016-07-15 ENCOUNTER — Ambulatory Visit (INDEPENDENT_AMBULATORY_CARE_PROVIDER_SITE_OTHER): Payer: BLUE CROSS/BLUE SHIELD | Admitting: Family Medicine

## 2016-07-15 VITALS — BP 121/82 | HR 91 | Temp 98.4°F | Resp 17 | Ht 63.0 in | Wt 202.0 lb

## 2016-07-15 DIAGNOSIS — E282 Polycystic ovarian syndrome: Secondary | ICD-10-CM | POA: Diagnosis not present

## 2016-07-15 DIAGNOSIS — R300 Dysuria: Secondary | ICD-10-CM | POA: Diagnosis not present

## 2016-07-15 DIAGNOSIS — D134 Benign neoplasm of liver: Secondary | ICD-10-CM | POA: Diagnosis not present

## 2016-07-15 DIAGNOSIS — J309 Allergic rhinitis, unspecified: Secondary | ICD-10-CM | POA: Diagnosis not present

## 2016-07-15 DIAGNOSIS — R1012 Left upper quadrant pain: Secondary | ICD-10-CM

## 2016-07-15 DIAGNOSIS — E669 Obesity, unspecified: Secondary | ICD-10-CM

## 2016-07-15 DIAGNOSIS — B353 Tinea pedis: Secondary | ICD-10-CM

## 2016-07-15 MED ORDER — MOMETASONE FUROATE 50 MCG/ACT NA SUSP: 2.0000 | Freq: Every day | NASAL | 12 refills | Status: DC

## 2016-07-15 MED ORDER — TERBINAFINE HCL 1 % EX CREA: 1.0000 "application " | TOPICAL_CREAM | Freq: Two times a day (BID) | CUTANEOUS | 0 refills | Status: DC

## 2016-07-15 NOTE — Progress Notes (Signed)
BP 121/82 (BP Location: Left Arm, Patient Position: Sitting, Cuff Size: Normal)   Pulse 91   Temp 98.4 F (36.9 C) (Oral)   Resp 17   Ht 5\' 3"  (1.6 m)   Wt 202 lb (91.6 kg)   LMP 06/17/2016   SpO2 98%   BMI 35.78 kg/m    Subjective:    Patient ID: Jasmine Tran, female    DOB: March 11, 1981, 36 y.o.   MRN: [id number]  HPI: Jasmine Tran is a 36 y.o. female  Chief Complaint  Patient presents with  . Dysuria    onset 1 week  . Follow-up    Birth control   URINARY SYMPTOMS Duration: 1 week Dysuria: burning Urinary frequency: yes Urgency: yes Small volume voids: no Symptom severity: moderate Urinary incontinence: no Foul odor: no Hematuria: no Abdominal pain: yes Back pain: no Suprapubic pain/pressure: no Flank pain: yes Fever:  no Vomiting: no Relief with cranberry juice: no Relief with pyridium: no Status: stable Previous urinary tract infection: yes Recurrent urinary tract infection: no Sexual activity: monogomous History of sexually transmitted disease: no Vaginal discharge: no Treatments attempted: increasing fluids   ER FOLLOW UP Time since discharge: 3 days Hospital/facility: [facility] Diagnosis: Abdominal pail of unknown origin Procedures/tests: CT scan of head- normal, CT scan abdomen- Normal. No adenomas, labs Consultants: None New medications: Lidocaine patch Discharge instructions: Follow up here  Status: stable  ABDOMINAL PAIN- seeing Dr. Allen Norris on Thursday for EGD/Colonoscopy Duration: chronic- significantly worse over the past week or so Onset: sudden Severity: severe Quality: sharp, burning, cramping Location:  LUQ  Episode duration:  Radiation: no Frequency: constant Alleviating factors: nothing Aggravating factors: nothing Status: worse Treatments attempted: antacids, PPI and H2 Blocker Fever: no Nausea: yes Vomiting: yes Weight loss: no Decreased appetite: no Diarrhea: no Constipation: no Blood in stool: no Heartburn:  no Jaundice: no Rash: no Dysuria/urinary frequency: no Hematuria: no History of sexually transmitted disease: no Recurrent NSAID use: no  Relevant past medical, surgical, family and social history reviewed and updated as indicated. Interim medical history since our last visit reviewed. Allergies and medications reviewed and updated.  Review of Systems  Constitutional: Negative.   Respiratory: Negative.   Cardiovascular: Negative.   Gastrointestinal: Positive for abdominal pain, nausea and vomiting. Negative for abdominal distention, anal bleeding, blood in stool, constipation, diarrhea and rectal pain.  Genitourinary: Positive for dysuria, frequency and urgency. Negative for decreased urine volume, difficulty urinating, dyspareunia, enuresis, flank pain, genital sores, hematuria, menstrual problem, pelvic pain, vaginal bleeding, vaginal discharge and vaginal pain.  Psychiatric/Behavioral: Negative.     Per HPI unless specifically indicated above     Objective:    BP 121/82 (BP Location: Left Arm, Patient Position: Sitting, Cuff Size: Normal)   Pulse 91   Temp 98.4 F (36.9 C) (Oral)   Resp 17   Ht 5\' 3"  (1.6 m)   Wt 202 lb (91.6 kg)   LMP 06/17/2016   SpO2 98%   BMI 35.78 kg/m   Wt Readings from Last 3 Encounters:  07/15/16 202 lb (91.6 kg)  07/12/16 200 lb (90.7 kg)  06/11/16 204 lb (92.5 kg)    Physical Exam  Constitutional: She is oriented to person, place, and time. She appears well-developed and well-nourished. No distress.  HENT:  Head: Normocephalic and atraumatic.  Right Ear: Hearing normal.  Left Ear: Hearing normal.  Nose: Nose normal.  Eyes: Conjunctivae and lids are normal. Right eye exhibits no discharge. Left eye exhibits no discharge. No scleral  icterus.  Cardiovascular: Normal rate, regular rhythm, normal heart sounds and intact distal pulses.  Exam reveals no gallop and no friction rub.   No murmur heard. Pulmonary/Chest: Effort normal and breath  sounds normal. No respiratory distress. She has no wheezes. She has no rales. She exhibits no tenderness.  Abdominal: Soft. Bowel sounds are normal. She exhibits no distension and no mass. There is tenderness. There is no rebound and no guarding.  Musculoskeletal: Normal range of motion.  Neurological: She is alert and oriented to person, place, and time.  Skin: Skin is warm, dry and intact. Rash (peeling irritated skin between the toes) noted. No erythema. No pallor.  Psychiatric: She has a normal mood and affect. Her speech is normal and behavior is normal. Judgment and thought content normal. Cognition and memory are normal.  Nursing note and vitals reviewed.   Results for orders placed or performed in visit on 07/15/16  Microscopic Examination  Result Value Ref Range   WBC, UA 0-5 0 - 5 /hpf   RBC, UA None seen 0 - 2 /hpf   Epithelial Cells (non renal) 0-10 0 - 10 /hpf   Mucus, UA Present (A) Not Estab.   Bacteria, UA Moderate (A) None seen/Few  UA/M w/rflx Culture, Routine (STAT)  Result Value Ref Range   Specific Gravity, UA 1.015 1.005 - 1.030   pH, UA 6.0 5.0 - 7.5   Color, UA Yellow Yellow   Appearance Ur Clear Clear   Leukocytes, UA Negative Negative   Protein, UA Trace (A) Negative/Trace   Glucose, UA Negative Negative   Ketones, UA Negative Negative   RBC, UA 1+ (A) Negative   Bilirubin, UA Negative Negative   Urobilinogen, Ur 0.2 0.2 - 1.0 mg/dL   Nitrite, UA Negative Negative   Microscopic Examination See below:    Urinalysis Reflex Comment   Urine Culture, Routine  Result Value Ref Range   Urine Culture, Routine WILL FOLLOW       Assessment & Plan:   Problem List Items Addressed This Visit      Respiratory   Allergic rhinitis    Will try nasonex to see if that helps. Call with any concerns.         Digestive   Adenoma of liver - Primary    CMP normal at ER. Will continue OCP and recheck in 3 months.         Endocrine   PCOS (polycystic ovarian  syndrome)    Tolerating OCP well. Continue to monitor.         Other   Obesity (BMI 30-39.9)    Discussed weight loss options. She will check with her insurance and see if there is anything covered.        Other Visit Diagnoses    Dysuria       UA negative- will await culture.    Relevant Orders   UA/M w/rflx Culture, Routine (STAT) (Completed)   Tinea pedis of left foot       Will treat with lamasil. Call with any concerns.    Relevant Medications   terbinafine (LAMISIL AT) 1 % cream   LUQ pain       To see GI on Thursday for colonoscopy and EGD. Await results.       Follow up plan: Return in about 3 months (around 10/12/2016).

## 2016-07-15 NOTE — Assessment & Plan Note (Signed)
CMP normal at ER. Will continue OCP and recheck in 3 months.

## 2016-07-15 NOTE — Assessment & Plan Note (Signed)
Tolerating OCP well. Continue to monitor.

## 2016-07-15 NOTE — Assessment & Plan Note (Signed)
Discussed weight loss options. She will check with her insurance and see if there is anything covered.

## 2016-07-15 NOTE — Assessment & Plan Note (Signed)
Will try nasonex to see if that helps. Call with any concerns.

## 2016-07-15 NOTE — Patient Instructions (Addendum)
Bupropion; Naltrexone extended-release tablets What is this medicine? BUPROPION; NALTREXONE (byoo PROE pee on; nal TREX one) is a combination product used to promote and maintain weight loss in obese adults or overweight adults who also have weight related medical problems. This medicine should be used with a reduced calorie diet and increased physical activity. This medicine may be used for other purposes; ask your health care provider or pharmacist if you have questions. COMMON BRAND NAME(S): CONTRAVE What should I tell my health care provider before I take this medicine? They need to know if you have any of these conditions: -an eating disorder, such as anorexia or bulimia -bipolar disorder -diabetes -depression -drug abuse or addiction -glaucoma -head injury -heart disease -high blood pressure -history of a tumor or infection of your brain or spine -history of stroke -history of irregular heartbeat -if you often drink alcohol -kidney disease -liver disease -schizophrenia -seizures -suicidal thoughts, plans, or attempt; a previous suicide attempt by you or a family member -an unusual or allergic reaction to bupropion, naltrexone, other medicines, foods, dyes, or preservatives -breast-feeding -pregnant or trying to become pregnant How should I use this medicine? Take this medicine by mouth with a glass of water. Follow the directions on the prescription label. Take this medicine in the morning and in the evenings as directed by your healthcare professional. You can take it with or without food. Do not take with high-fat meals as this may increase your risk of seizures. Do not crush, chew, or cut these tablets. Do not take your medicine more often than directed. Do not stop taking this medicine suddenly except upon the advice of your doctor. A special MedGuide will be given to you by the pharmacist with each prescription and refill. Be sure to read this information carefully each  time. Talk to your pediatrician regarding the use of this medicine in children. Special care may be needed. Overdosage: If you think you have taken too much of this medicine contact a poison control center or emergency room at once. NOTE: This medicine is only for you. Do not share this medicine with others. What if I miss a dose? If you miss a dose, skip the missed dose and take your next tablet at the regular time. Do not take double or extra doses. What may interact with this medicine? Do not take this medicine with any of the following medications: -any prescription or street opioid drug like codeine, heroin, methadone -linezolid -MAOIs like Carbex, Eldepryl, Marplan, Nardil, and Parnate -methylene blue (injected into a vein) -other medicines that contain bupropion like Zyban or Wellbutrin This medicine may also interact with the following medications: -alcohol -certain medicines for anxiety or sleep -certain medicines for blood pressure like metoprolol, propranolol -certain medicines for depression or psychotic disturbances -certain medicines for HIV or AIDS like efavirenz, lopinavir, nelfinavir, ritonavir -certain medicines for irregular heart beat like propafenone, flecainide -certain medicines for Parkinson's disease like amantadine, levodopa -certain medicines for seizures like carbamazepine, phenytoin, phenobarbital -cimetidine -clopidogrel -cyclophosphamide -digoxin -disulfiram -furazolidone -isoniazid -nicotine -orphenadrine -procarbazine -steroid medicines like prednisone or cortisone -stimulant medicines for attention disorders, weight loss, or to stay awake -tamoxifen -theophylline -thioridazine -thiotepa -ticlopidine -tramadol -warfarin This list may not describe all possible interactions. Give your health care provider a list of all the medicines, herbs, non-prescription drugs, or dietary supplements you use. Also tell them if you smoke, drink alcohol, or use  illegal drugs. Some items may interact with your medicine. What should I watch for while using this   medicine? This medicine is intended to be used in addition to a healthy diet and appropriate exercise. The best results are achieved this way. Do not increase or in any way change your dose without consulting your doctor or health care professional. Do not take this medicine with other prescription or over-the-counter weight loss products without consulting your doctor or health care professional. Your doctor should tell you to stop taking this medicine if you do not lose a certain amount of weight within the first 12 weeks of treatment. Visit your doctor or health care professional for regular checkups. Your doctor may order blood tests or other tests to see how you are doing. This medicine may affect blood sugar levels. If you have diabetes, check with your doctor or health care professional before you change your diet or the dose of your diabetic medicine. Patients and their families should watch out for new or worsening depression or thoughts of suicide. Also watch out for sudden changes in feelings such as feeling anxious, agitated, panicky, irritable, hostile, aggressive, impulsive, severely restless, overly excited and hyperactive, or not being able to sleep. If this happens, especially at the beginning of treatment or after a change in dose, call your health care professional. Avoid alcoholic drinks while taking this medicine. Drinking large amounts of alcoholic beverages, using sleeping or anxiety medicines, or quickly stopping the use of these agents while taking this medicine may increase your risk for a seizure. What side effects may I notice from receiving this medicine? Side effects that you should report to your doctor or health care professional as soon as possible: -allergic reactions like skin rash, itching or hives, swelling of the face, lips, or tongue -breathing problems -changes in  vision -confusion -elevated mood, decreased need for sleep, racing thoughts, impulsive behavior -fast or irregular heartbeat -hallucinations, loss of contact with reality -increased blood pressure -redness, blistering, peeling or loosening of the skin, including inside the mouth -seizures -signs and symptoms of liver injury like dark yellow or brown urine; general ill feeling or flu-like symptoms; light-colored stools; loss of appetite; nausea; right upper belly pain; unusually weak or tired; yellowing of the eyes or skin -suicidal thoughts or other mood changes -vomiting Side effects that usually do not require medical attention (report to your doctor or health care professional if they continue or are bothersome): -constipation -headache -loss of appetite -indigestion, stomach upset -tremors This list may not describe all possible side effects. Call your doctor for medical advice about side effects. You may report side effects to FDA at 1-800-FDA-1088. Where should I keep my medicine? Keep out of the reach of children. Store at room temperature between 15 and 30 degrees C (59 and 86 degrees F). Throw away any unused medicine after the expiration date. NOTE: This sheet is a summary. It may not cover all possible information. If you have questions about this medicine, talk to your doctor, pharmacist, or health care provider.  2018 Elsevier/Gold Standard (2015-10-26 13:42:58) Liraglutide injection (Weight Management) What is this medicine? LIRAGLUTIDE (LIR a GLOO tide) is used with a reduced calorie diet and exercise to help you lose weight. This medicine may be used for other purposes; ask your health care provider or pharmacist if you have questions. COMMON BRAND NAME(S): Saxenda What should I tell my health care provider before I take this medicine? They need to know if you have any of these conditions: -endocrine tumors (MEN 2) or if someone in your family had these  tumors -gallbladder disease -  high cholesterol -history of alcohol abuse problem -history of pancreatitis -kidney disease or if you are on dialysis -liver disease -previous swelling of the tongue, face, or lips with difficulty breathing, difficulty swallowing, hoarseness, or tightening of the throat -stomach problems -suicidal thoughts, plans, or attempt; a previous suicide attempt by you or a family member -thyroid cancer or if someone in your family had thyroid cancer -an unusual or allergic reaction to liraglutide, other medicines, foods, dyes, or preservatives -pregnant or trying to get pregnant -breast-feeding How should I use this medicine? This medicine is for injection under the skin of your upper leg, stomach area, or upper arm. You will be taught how to prepare and give this medicine. Use exactly as directed. Take your medicine at regular intervals. Do not take it more often than directed. It is important that you put your used needles and syringes in a special sharps container. Do not put them in a trash can. If you do not have a sharps container, call your pharmacist or healthcare provider to get one. A special MedGuide will be given to you by the pharmacist with each prescription and refill. Be sure to read this information carefully each time. Talk to your pediatrician regarding the use of this medicine in children. Special care may be needed. Overdosage: If you think you have taken too much of this medicine contact a poison control center or emergency room at once. NOTE: This medicine is only for you. Do not share this medicine with others. What if I miss a dose? If you miss a dose, take it as soon as you can. If it is almost time for your next dose, take only that dose. Do not take double or extra doses. If you miss your dose for 3 days or more, call your doctor or health care professional to talk about how to restart this medicine. What may interact with this  medicine? -insulin and other medicines for diabetes This list may not describe all possible interactions. Give your health care provider a list of all the medicines, herbs, non-prescription drugs, or dietary supplements you use. Also tell them if you smoke, drink alcohol, or use illegal drugs. Some items may interact with your medicine. What should I watch for while using this medicine? Visit your doctor or health care professional for regular checks on your progress. This medicine is intended to be used in addition to a healthy diet and appropriate exercise. The best results are achieved this way. Do not increase or in any way change your dose without consulting your doctor or health care professional. Drink plenty of fluids while taking this medicine. Check with your doctor or health care professional if you get an attack of severe diarrhea, nausea, and vomiting. The loss of too much body fluid can make it dangerous for you to take this medicine. This medicine may affect blood sugar levels. If you have diabetes, check with your doctor or health care professional before you change your diet or the dose of your diabetic medicine. Patients and their families should watch out for worsening depression or thoughts of suicide. Also watch out for sudden changes in feelings such as feeling anxious, agitated, panicky, irritable, hostile, aggressive, impulsive, severely restless, overly excited and hyperactive, or not being able to sleep. If this happens, especially at the beginning of treatment or after a change in dose, call your health care professional. What side effects may I notice from receiving this medicine? Side effects that you should report to your   doctor or health care professional as soon as possible: -allergic reactions like skin rash, itching or hives, swelling of the face, lips, or tongue -breathing problems -diarrhea that continues or is severe -lump or swelling on the neck -severe  nausea -signs and symptoms of infection like fever or chills; cough; sore throat; pain or trouble passing urine -signs and symptoms of low blood sugar such as feeling anxious, confusion, dizziness, increased hunger, unusually weak or tired, sweating, shakiness, cold, irritable, headache, blurred vision, fast heartbeat, loss of consciousness -signs and symptoms of kidney injury like trouble passing urine or change in the amount of urine -trouble swallowing -unusual stomach upset or pain -vomiting Side effects that usually do not require medical attention (report to your doctor or health care professional if they continue or are bothersome): -constipation -decreased appetite -diarrhea -fatigue -headache -nausea -pain, redness, or irritation at site where injected -stomach upset -stuffy or runny nose This list may not describe all possible side effects. Call your doctor for medical advice about side effects. You may report side effects to FDA at 1-800-FDA-1088. Where should I keep my medicine? Keep out of the reach of children. Store unopened pen in a refrigerator between 2 and 8 degrees C (36 and 46 degrees F). Do not freeze or use if the medicine has been frozen. Protect from light and excessive heat. After you first use the pen, it can be stored at room temperature between 15 and 30 degrees C (59 and 86 degrees F) or in a refrigerator. Throw away your used pen after 30 days or after the expiration date, whichever comes first. Do not store your pen with the needle attached. If the needle is left on, medicine may leak from the pen. NOTE: This sheet is a summary. It may not cover all possible information. If you have questions about this medicine, talk to your doctor, pharmacist, or health care provider.  2018 Elsevier/Gold Standard (2016-05-22 14:41:37)  

## 2016-07-16 NOTE — Discharge Instructions (Signed)

## 2016-07-17 ENCOUNTER — Ambulatory Visit: Payer: BLUE CROSS/BLUE SHIELD | Admitting: Anesthesiology

## 2016-07-17 ENCOUNTER — Encounter
Admission: RE | Disposition: A | Discharge status: Home / Self Care | Payer: Self-pay | Source: Ambulatory Visit | Attending: Gastroenterology

## 2016-07-17 ENCOUNTER — Ambulatory Visit
Admission: RE | Admit: 2016-07-17 | Discharge: 2016-07-17 | Disposition: A | Discharge status: Home / Self Care | Payer: BLUE CROSS/BLUE SHIELD | Source: Ambulatory Visit | Attending: Gastroenterology | Admitting: Gastroenterology

## 2016-07-17 DIAGNOSIS — Z91048 Other nonmedicinal substance allergy status: Secondary | ICD-10-CM | POA: Insufficient documentation

## 2016-07-17 DIAGNOSIS — Z79899 Other long term (current) drug therapy: Secondary | ICD-10-CM | POA: Insufficient documentation

## 2016-07-17 DIAGNOSIS — R12 Heartburn: Secondary | ICD-10-CM

## 2016-07-17 DIAGNOSIS — F319 Bipolar disorder, unspecified: Secondary | ICD-10-CM | POA: Diagnosis not present

## 2016-07-17 DIAGNOSIS — Z8249 Family history of ischemic heart disease and other diseases of the circulatory system: Secondary | ICD-10-CM | POA: Insufficient documentation

## 2016-07-17 DIAGNOSIS — Z9049 Acquired absence of other specified parts of digestive tract: Secondary | ICD-10-CM | POA: Insufficient documentation

## 2016-07-17 DIAGNOSIS — F988 Other specified behavioral and emotional disorders with onset usually occurring in childhood and adolescence: Secondary | ICD-10-CM | POA: Insufficient documentation

## 2016-07-17 DIAGNOSIS — K2950 Unspecified chronic gastritis without bleeding: Secondary | ICD-10-CM | POA: Diagnosis not present

## 2016-07-17 DIAGNOSIS — K449 Diaphragmatic hernia without obstruction or gangrene: Secondary | ICD-10-CM | POA: Diagnosis not present

## 2016-07-17 DIAGNOSIS — Z881 Allergy status to other antibiotic agents status: Secondary | ICD-10-CM | POA: Diagnosis not present

## 2016-07-17 DIAGNOSIS — K219 Gastro-esophageal reflux disease without esophagitis: Secondary | ICD-10-CM | POA: Insufficient documentation

## 2016-07-17 DIAGNOSIS — Z87891 Personal history of nicotine dependence: Secondary | ICD-10-CM | POA: Insufficient documentation

## 2016-07-17 DIAGNOSIS — K2930 Chronic superficial gastritis without bleeding: Secondary | ICD-10-CM | POA: Diagnosis not present

## 2016-07-17 DIAGNOSIS — Z888 Allergy status to other drugs, medicaments and biological substances status: Secondary | ICD-10-CM | POA: Insufficient documentation

## 2016-07-17 DIAGNOSIS — Z9889 Other specified postprocedural states: Secondary | ICD-10-CM | POA: Insufficient documentation

## 2016-07-17 DIAGNOSIS — Z885 Allergy status to narcotic agent status: Secondary | ICD-10-CM | POA: Diagnosis not present

## 2016-07-17 DIAGNOSIS — Z7951 Long term (current) use of inhaled steroids: Secondary | ICD-10-CM | POA: Diagnosis not present

## 2016-07-17 HISTORY — PX: ESOPHAGOGASTRODUODENOSCOPY (EGD) WITH PROPOFOL: SHX5813

## 2016-07-17 HISTORY — DX: Gastro-esophageal reflux disease without esophagitis: K21.9

## 2016-07-17 LAB — UA/M W/RFLX CULTURE, ROUTINE
Bilirubin, UA: NEGATIVE
Glucose, UA: NEGATIVE
Ketones, UA: NEGATIVE
Leukocytes, UA: NEGATIVE
NITRITE UA: NEGATIVE
PH UA: 6 (ref 5.0–7.5)
Specific Gravity, UA: 1.015 (ref 1.005–1.030)
UUROB: 0.2 mg/dL (ref 0.2–1.0)

## 2016-07-17 LAB — MICROSCOPIC EXAMINATION: RBC, UA: NONE SEEN /hpf (ref 0–?)

## 2016-07-17 LAB — URINE CULTURE, REFLEX: Organism ID, Bacteria: NO GROWTH

## 2016-07-17 SURGERY — ESOPHAGOGASTRODUODENOSCOPY (EGD) WITH PROPOFOL
Anesthesia: Monitor Anesthesia Care | Wound class: Clean Contaminated

## 2016-07-17 MED ORDER — GLYCOPYRROLATE 0.2 MG/ML IJ SOLN
INTRAMUSCULAR | Status: DC | PRN
  Administered 2016-07-17: 0.2 mg via INTRAVENOUS

## 2016-07-17 MED ORDER — ACETAMINOPHEN 160 MG/5ML PO SOLN: 325.0000 mg | ORAL | Status: DC | PRN

## 2016-07-17 MED ORDER — LIDOCAINE HCL (CARDIAC) 20 MG/ML IV SOLN
INTRAVENOUS | Status: DC | PRN
  Administered 2016-07-17: 50 mg via INTRAVENOUS

## 2016-07-17 MED ORDER — PROPOFOL 10 MG/ML IV BOLUS
INTRAVENOUS | Status: DC | PRN
  Administered 2016-07-17: 200 mg via INTRAVENOUS

## 2016-07-17 MED ORDER — LACTATED RINGERS IV SOLN
INTRAVENOUS | Status: DC | PRN
  Administered 2016-07-17: 10:00:00 via INTRAVENOUS

## 2016-07-17 MED ORDER — ACETAMINOPHEN 325 MG PO TABS: 325.0000 mg | ORAL_TABLET | ORAL | Status: DC | PRN

## 2016-07-17 SURGICAL SUPPLY — 35 items
BALLN DILATOR 10-12 8 (BALLOONS)
BALLN DILATOR 12-15 8 (BALLOONS)
BALLN DILATOR 15-18 8 (BALLOONS)
BALLN DILATOR CRE 0-12 8 (BALLOONS)
BALLN DILATOR ESOPH 8 10 CRE (MISCELLANEOUS) IMPLANT
BALLOON DILATOR 12-15 8 (BALLOONS) IMPLANT
BALLOON DILATOR 15-18 8 (BALLOONS) IMPLANT
BALLOON DILATOR CRE 0-12 8 (BALLOONS) IMPLANT
BLOCK BITE 60FR ADLT L/F GRN (MISCELLANEOUS) ×2 IMPLANT
CANISTER SUCT 1200ML W/VALVE (MISCELLANEOUS) ×2 IMPLANT
CLIP HMST 235XBRD CATH ROT (MISCELLANEOUS) IMPLANT
CLIP RESOLUTION 360 11X235 (MISCELLANEOUS)
FCP ESCP3.2XJMB 240X2.8X (MISCELLANEOUS)
FORCEPS BIOP RAD 4 LRG CAP 4 (CUTTING FORCEPS) ×2 IMPLANT
FORCEPS BIOP RJ4 240 W/NDL (MISCELLANEOUS)
FORCEPS ESCP3.2XJMB 240X2.8X (MISCELLANEOUS) IMPLANT
GOWN CVR UNV OPN BCK APRN NK (MISCELLANEOUS) ×2 IMPLANT
GOWN ISOL THUMB LOOP REG UNIV (MISCELLANEOUS) ×2
INJECTOR VARIJECT VIN23 (MISCELLANEOUS) IMPLANT
KIT DEFENDO VALVE AND CONN (KITS) IMPLANT
KIT ENDO PROCEDURE OLY (KITS) ×2 IMPLANT
MARKER SPOT ENDO TATTOO 5ML (MISCELLANEOUS) IMPLANT
PAD GROUND ADULT SPLIT (MISCELLANEOUS) IMPLANT
PROBE APC STR FIRE (PROBE) IMPLANT
RETRIEVER NET PLAT FOOD (MISCELLANEOUS) IMPLANT
RETRIEVER NET ROTH 2.5X230 LF (MISCELLANEOUS) IMPLANT
SNARE SHORT THROW 13M SML OVAL (MISCELLANEOUS) IMPLANT
SNARE SHORT THROW 30M LRG OVAL (MISCELLANEOUS) IMPLANT
SNARE SNG USE RND 15MM (INSTRUMENTS) IMPLANT
SPOT EX ENDOSCOPIC TATTOO (MISCELLANEOUS)
SYR INFLATION 60ML (SYRINGE) IMPLANT
TRAP ETRAP POLY (MISCELLANEOUS) IMPLANT
VARIJECT INJECTOR VIN23 (MISCELLANEOUS)
WATER STERILE IRR 250ML POUR (IV SOLUTION) ×2 IMPLANT
WIRE CRE 18-20MM 8CM F G (MISCELLANEOUS) IMPLANT

## 2016-07-17 NOTE — Transfer of Care (Signed)
Immediate Anesthesia Transfer of Care Note  Patient: Jasmine Tran  Procedure(s) Performed: Procedure(s): ESOPHAGOGASTRODUODENOSCOPY (EGD) WITH PROPOFOL (N/A)  Patient Location: PACU  Anesthesia Type: MAC  Level of Consciousness: awake, alert  and patient cooperative  Airway and Oxygen Therapy: Patient Spontanous Breathing and Patient connected to supplemental oxygen  Post-op Assessment: Post-op Vital signs reviewed, Patient's Cardiovascular Status Stable, Respiratory Function Stable, Patent Airway and No signs of Nausea or vomiting  Post-op Vital Signs: Reviewed and stable  Complications: No apparent anesthesia complications

## 2016-07-17 NOTE — Anesthesia Postprocedure Evaluation (Signed)
Anesthesia Post Note  Patient: Jasmine Tran  Procedure(s) Performed: Procedure(s) (LRB): ESOPHAGOGASTRODUODENOSCOPY (EGD) WITH PROPOFOL (N/A)  Patient location during evaluation: PACU Anesthesia Type: MAC Level of consciousness: awake and alert and oriented Pain management: satisfactory to patient Vital Signs Assessment: post-procedure vital signs reviewed and stable Respiratory status: spontaneous breathing, nonlabored ventilation and respiratory function stable Cardiovascular status: blood pressure returned to baseline and stable Postop Assessment: Adequate PO intake and No signs of nausea or vomiting Anesthetic complications: no    Raliegh Ip

## 2016-07-17 NOTE — Op Note (Signed)
[facility] Gastroenterology Patient Name: Jasmine Tran Procedure Date: 07/17/2016 9:34 AM MRN: [id number] Account #: [id number] Date of Birth: March 04, 1981 Admit Type: Outpatient Age: 36 Room: [facility] OR ROOM 01 Gender: Female Note Status: Finalized Procedure:            Upper GI endoscopy Indications:          Heartburn Providers:            Lucilla Lame MD, MD Referring MD:         Valerie Roys (Referring MD) Medicines:            Propofol per Anesthesia Complications:        No immediate complications. Procedure:            Pre-Anesthesia Assessment:                       - Prior to the procedure, a History and Physical was                        performed, and patient medications and allergies were                        reviewed. The patient's tolerance of previous                        anesthesia was also reviewed. The risks and benefits of                        the procedure and the sedation options and risks were                        discussed with the patient. All questions were                        answered, and informed consent was obtained. Prior                        Anticoagulants: The patient has taken no previous                        anticoagulant or antiplatelet agents. ASA Grade                        Assessment: II - A patient with mild systemic disease.                        After reviewing the risks and benefits, the patient was                        deemed in satisfactory condition to undergo the                        procedure.                       After obtaining informed consent, the endoscope was                        passed under direct vision. Throughout the procedure,  the patient's blood pressure, pulse, and oxygen                        saturations were monitored continuously. The Olympus                        GIF H180J Endoscope [id number] was introduced through                        the  mouth, and advanced to the second part of duodenum.                        The upper GI endoscopy was accomplished without                        difficulty. The patient tolerated the procedure well. Findings:      A small hiatal hernia was present.      The entire examined stomach was normal. Biopsies were taken with a cold       forceps for Helicobacter pylori testing.      The examined duodenum was normal. Impression:           - Small hiatal hernia.                       - Normal stomach. Biopsied.                       - Normal examined duodenum. Recommendation:       - Discharge patient to home.                       - Resume previous diet.                       - Continue present medications.                       - Await pathology results.                       - Perform ambulatory pH monitoring. Procedure Code(s):    --- Professional ---                       [phone], Esophagogastroduodenoscopy, flexible, transoral;                        with biopsy, single or multiple Diagnosis Code(s):    --- Professional ---                       R12, Heartburn                       K44.9, Diaphragmatic hernia without obstruction or                        gangrene CPT copyright 2016 American Medical Association. All rights reserved. The codes documented in this report are preliminary and upon coder review may  be revised to meet current compliance requirements. Lucilla Lame MD, MD 07/17/2016 9:54:31 AM This report has been signed electronically. Number of Addenda: 0 Note Initiated On: 07/17/2016 9:34 AM Total Procedure Duration: 0 hours 3 minutes  [place] seconds       [facility]

## 2016-07-17 NOTE — H&P (Signed)
Lucilla Lame, MD [facility] [address], [place],  [address] Phone: [phone] Fax : [phone]  Primary Care Physician:  Park Liter, DO Primary Gastroenterologist:  Dr. Allen Norris  Pre-Procedure History & Physical: HPI:  Jasmine Tran is a 36 y.o. female is here for an endoscopy.   Past Medical History:  Diagnosis Date  . ADD (attention deficit disorder)   . Bipolar 1 disorder with moderate mania ([place])   . GERD (gastroesophageal reflux disease)     Past Surgical History:  Procedure Laterality Date  . APPENDECTOMY    . CHOLECYSTECTOMY    . OPEN HEPATECTOMY   06/04/2015   Liver adenoma removal  . TUMOR REMOVAL     Liver, benign    Prior to Admission medications   Medication Sig Start Date End Date Taking? Authorizing Provider  EPINEPHrine (EPIPEN 2-PAK) 0.3 mg/0.3 mL IJ SOAJ injection Inject 0.3 mLs (0.3 mg total) into the muscle once. 10/23/15  Yes Megan P Johnson, DO  famotidine (PEPCID) 20 MG tablet Take 1 tablet (20 mg total) by mouth 2 (two) times daily. 04/02/16  Yes Christopher End, MD  L-Methylfolate-Algae (DEPLIN 15) 15-90.314 MG CAPS Take 1 tablet by mouth daily. 05/15/16  Yes Megan P Johnson, DO  lamoTRIgine (LAMICTAL) 200 MG tablet Take 1 tablet (200 mg total) by mouth daily. 05/15/16  Yes Megan P Johnson, DO  lisdexamfetamine (VYVANSE) 50 MG capsule Take 1 capsule (50 mg total) by mouth daily. 05/15/16  Yes Megan P Johnson, DO  mometasone (NASONEX) 50 MCG/ACT nasal spray Place 2 sprays into the nose daily. 07/15/16  Yes Megan P Johnson, DO  montelukast (SINGULAIR) 10 MG tablet  04/25/16  Yes Historical Provider, MD  norethindrone-ethinyl estradiol (JUNEL FE 1/20) 1-20 MG-MCG tablet Take 1 tablet by mouth daily. 06/23/16  Yes Megan P Johnson, DO  oxcarbazepine (TRILEPTAL) 600 MG tablet Take 1 tablet (600 mg total) by mouth 2 (two) times daily. 05/15/16  Yes Megan P Johnson, DO  terbinafine (LAMISIL AT) 1 % cream Apply 1 application topically 2 (two) times  daily. 07/15/16   Megan P Johnson, DO    Allergies as of 07/11/2016 - Review Complete 06/11/2016  Allergen Reaction Noted  . Tape Dermatitis 04/20/2015  . Doxycycline  03/30/2015  . Morphine and related Hives 03/19/2015  . Phenergan [promethazine hcl] Nausea And Vomiting 03/19/2015  . Sertraline  03/30/2015  . Prednisone Nausea And Vomiting 06/26/2015    Family History  Problem Relation Age of Onset  . Heart disease Mother   . Heart disease Father   . Hypertension Brother   . Cancer Neg Hx   . COPD Neg Hx   . Diabetes Neg Hx   . Stroke Neg Hx     Social History   Social History  . Marital status: Married    Spouse name: N/A  . Number of children: N/A  . Years of education: N/A   Occupational History  . Not on file.   Social History Main Topics  . Smoking status: Former Smoker    Packs/day: 0.25    Years: 5.00    Types: Cigarettes    Quit date: 06/04/2015  . Smokeless tobacco: Never Used  . Alcohol use No  . Drug use: No  . Sexual activity: Yes    Birth control/ protection: None   Other Topics Concern  . Not on file   Social History Narrative  . No narrative on file    Review of Systems: See HPI, otherwise negative ROS  Physical Exam: BP 108/71   Pulse 73   Temp 98.1 F (36.7 C) (Temporal)   Ht 5\' 3"  (1.6 m)   Wt 202 lb (91.6 kg)   LMP 06/10/2016 (Approximate) Comment: patient unable to void, anesthesia is aware  SpO2 98%   BMI 35.78 kg/m  General:   Alert,  pleasant and cooperative in NAD Head:  Normocephalic and atraumatic. Neck:  Supple; no masses or thyromegaly. Lungs:  Clear throughout to auscultation.    Heart:  Regular rate and rhythm. Abdomen:  Soft, nontender and nondistended. Normal bowel sounds, without guarding, and without rebound.   Neurologic:  Alert and  oriented x4;  grossly normal neurologically.  Impression/Plan: Jasmine Tran is here for an endoscopy to be performed for GERD  Risks, benefits, limitations, and alternatives  regarding  endoscopy have been reviewed with the patient.  Questions have been answered.  All parties agreeable.   Lucilla Lame, MD  07/17/2016, 9:00 AM

## 2016-07-17 NOTE — Anesthesia Preprocedure Evaluation (Signed)
Anesthesia Evaluation  Patient identified by MRN, date of birth, ID band Patient awake    Reviewed: Allergy & Precautions, H&P , NPO status , Patient's Chart, lab work & pertinent test results  Airway Mallampati: II  TM Distance: >3 FB Neck ROM: full    Dental no notable dental hx.    Pulmonary former smoker,    Pulmonary exam normal        Cardiovascular Normal cardiovascular exam     Neuro/Psych PSYCHIATRIC DISORDERS    GI/Hepatic GERD  ,  Endo/Other  PCOS  Renal/GU      Musculoskeletal   Abdominal   Peds  Hematology   Anesthesia Other Findings   Reproductive/Obstetrics                             Anesthesia Physical Anesthesia Plan  ASA: II  Anesthesia Plan: MAC   Post-op Pain Management:    Induction:   Airway Management Planned:   Additional Equipment:   Intra-op Plan:   Post-operative Plan:   Informed Consent: I have reviewed the patients History and Physical, chart, labs and discussed the procedure including the risks, benefits and alternatives for the proposed anesthesia with the patient or authorized representative who has indicated his/her understanding and acceptance.     Plan Discussed with:   Anesthesia Plan Comments:         Anesthesia Quick Evaluation

## 2016-07-18 ENCOUNTER — Ambulatory Visit: Payer: BLUE CROSS/BLUE SHIELD | Admitting: Anesthesiology

## 2016-07-18 ENCOUNTER — Encounter: Payer: Self-pay | Admitting: Gastroenterology

## 2016-07-18 ENCOUNTER — Ambulatory Visit
Admission: RE | Admit: 2016-07-18 | Discharge: 2016-07-18 | Disposition: A | Discharge status: Home / Self Care | Payer: BLUE CROSS/BLUE SHIELD | Source: Ambulatory Visit | Attending: Gastroenterology | Admitting: Gastroenterology

## 2016-07-18 ENCOUNTER — Other Ambulatory Visit: Payer: Self-pay

## 2016-07-18 ENCOUNTER — Encounter
Admission: RE | Disposition: A | Discharge status: Home / Self Care | Payer: Self-pay | Source: Ambulatory Visit | Attending: Gastroenterology

## 2016-07-18 DIAGNOSIS — Z8249 Family history of ischemic heart disease and other diseases of the circulatory system: Secondary | ICD-10-CM | POA: Insufficient documentation

## 2016-07-18 DIAGNOSIS — Z87891 Personal history of nicotine dependence: Secondary | ICD-10-CM | POA: Insufficient documentation

## 2016-07-18 DIAGNOSIS — Z793 Long term (current) use of hormonal contraceptives: Secondary | ICD-10-CM | POA: Diagnosis not present

## 2016-07-18 DIAGNOSIS — F319 Bipolar disorder, unspecified: Secondary | ICD-10-CM | POA: Insufficient documentation

## 2016-07-18 DIAGNOSIS — R1011 Right upper quadrant pain: Secondary | ICD-10-CM

## 2016-07-18 DIAGNOSIS — F988 Other specified behavioral and emotional disorders with onset usually occurring in childhood and adolescence: Secondary | ICD-10-CM | POA: Insufficient documentation

## 2016-07-18 DIAGNOSIS — R1012 Left upper quadrant pain: Secondary | ICD-10-CM | POA: Insufficient documentation

## 2016-07-18 DIAGNOSIS — K649 Unspecified hemorrhoids: Secondary | ICD-10-CM | POA: Diagnosis not present

## 2016-07-18 DIAGNOSIS — Z79899 Other long term (current) drug therapy: Secondary | ICD-10-CM | POA: Diagnosis not present

## 2016-07-18 DIAGNOSIS — Z885 Allergy status to narcotic agent status: Secondary | ICD-10-CM | POA: Insufficient documentation

## 2016-07-18 DIAGNOSIS — Z7951 Long term (current) use of inhaled steroids: Secondary | ICD-10-CM | POA: Diagnosis not present

## 2016-07-18 DIAGNOSIS — Z881 Allergy status to other antibiotic agents status: Secondary | ICD-10-CM | POA: Diagnosis not present

## 2016-07-18 DIAGNOSIS — K640 First degree hemorrhoids: Secondary | ICD-10-CM | POA: Insufficient documentation

## 2016-07-18 DIAGNOSIS — Z888 Allergy status to other drugs, medicaments and biological substances status: Secondary | ICD-10-CM | POA: Diagnosis not present

## 2016-07-18 DIAGNOSIS — Z91048 Other nonmedicinal substance allergy status: Secondary | ICD-10-CM | POA: Insufficient documentation

## 2016-07-18 DIAGNOSIS — K219 Gastro-esophageal reflux disease without esophagitis: Secondary | ICD-10-CM | POA: Diagnosis not present

## 2016-07-18 HISTORY — PX: COLONOSCOPY WITH PROPOFOL: SHX5780

## 2016-07-18 SURGERY — COLONOSCOPY WITH PROPOFOL
Anesthesia: Monitor Anesthesia Care | Wound class: Contaminated

## 2016-07-18 MED ORDER — LACTATED RINGERS IV SOLN
INTRAVENOUS | Status: DC | PRN
  Administered 2016-07-18: 12:00:00 via INTRAVENOUS

## 2016-07-18 MED ORDER — PROPOFOL 10 MG/ML IV BOLUS
INTRAVENOUS | Status: DC | PRN
  Administered 2016-07-18 (×3): 50 mg via INTRAVENOUS
  Administered 2016-07-18: 100 mg via INTRAVENOUS
  Administered 2016-07-18: 50 mg via INTRAVENOUS

## 2016-07-18 MED ORDER — LIDOCAINE HCL (CARDIAC) 20 MG/ML IV SOLN
INTRAVENOUS | Status: DC | PRN
  Administered 2016-07-18: 50 mg via INTRAVENOUS

## 2016-07-18 MED ORDER — STERILE WATER FOR IRRIGATION IR SOLN
Status: DC | PRN
  Administered 2016-07-18: 12:00:00

## 2016-07-18 SURGICAL SUPPLY — 23 items

## 2016-07-18 NOTE — Anesthesia Postprocedure Evaluation (Signed)
Anesthesia Post Note  Patient: Jasmine Tran  Procedure(s) Performed: Procedure(s) (LRB): COLONOSCOPY WITH PROPOFOL (N/A)  Patient location during evaluation: PACU Anesthesia Type: MAC Level of consciousness: awake and alert Pain management: pain level controlled Vital Signs Assessment: post-procedure vital signs reviewed and stable Respiratory status: spontaneous breathing, nonlabored ventilation, respiratory function stable and patient connected to nasal cannula oxygen Cardiovascular status: stable and blood pressure returned to baseline Anesthetic complications: no    Alisa Graff

## 2016-07-18 NOTE — H&P (Signed)
Jasmine Lame, MD [facility] [address], [place], [place] [address] Phone: [phone] Fax : [phone]  Primary Care Physician:  Park Liter, DO Primary Gastroenterologist:  Dr. Allen Norris  Pre-Procedure History & Physical: HPI:  Jasmine Tran is a 36 y.o. female is here for an colonoscopy.   Past Medical History:  Diagnosis Date  . ADD (attention deficit disorder)   . Bipolar 1 disorder with moderate mania ([place])   . GERD (gastroesophageal reflux disease)     Past Surgical History:  Procedure Laterality Date  . APPENDECTOMY    . CHOLECYSTECTOMY    . OPEN HEPATECTOMY   06/04/2015   Liver adenoma removal  . TUMOR REMOVAL     Liver, benign    Prior to Admission medications   Medication Sig Start Date End Date Taking? Authorizing Provider  famotidine (PEPCID) 20 MG tablet Take 1 tablet (20 mg total) by mouth 2 (two) times daily. 04/02/16  Yes Christopher End, MD  L-Methylfolate-Algae (DEPLIN 15) 15-90.314 MG CAPS Take 1 tablet by mouth daily. 05/15/16  Yes Megan P Johnson, DO  lamoTRIgine (LAMICTAL) 200 MG tablet Take 1 tablet (200 mg total) by mouth daily. 05/15/16  Yes Megan P Johnson, DO  lisdexamfetamine (VYVANSE) 50 MG capsule Take 1 capsule (50 mg total) by mouth daily. 05/15/16  Yes Megan P Johnson, DO  mometasone (NASONEX) 50 MCG/ACT nasal spray Place 2 sprays into the nose daily. 07/15/16  Yes Megan P Johnson, DO  montelukast (SINGULAIR) 10 MG tablet  04/25/16  Yes Historical Provider, MD  norethindrone-ethinyl estradiol (JUNEL FE 1/20) 1-20 MG-MCG tablet Take 1 tablet by mouth daily. 06/23/16  Yes Megan P Johnson, DO  oxcarbazepine (TRILEPTAL) 600 MG tablet Take 1 tablet (600 mg total) by mouth 2 (two) times daily. 05/15/16  Yes Megan P Johnson, DO  terbinafine (LAMISIL AT) 1 % cream Apply 1 application topically 2 (two) times daily. 07/15/16  Yes Megan P Johnson, DO  EPINEPHrine (EPIPEN 2-PAK) 0.3 mg/0.3 mL IJ SOAJ injection Inject 0.3 mLs (0.3 mg total) into the muscle  once. 10/23/15   Megan P Johnson, DO    Allergies as of 07/18/2016 - Review Complete 07/18/2016  Allergen Reaction Noted  . Tape Dermatitis 04/20/2015  . Doxycycline Nausea And Vomiting 03/30/2015  . Morphine and related Hives 03/19/2015  . Phenergan [promethazine hcl] Nausea And Vomiting 03/19/2015  . Sertraline  03/30/2015  . Prednisone Nausea And Vomiting 06/26/2015    Family History  Problem Relation Age of Onset  . Heart disease Mother   . Heart disease Father   . Hypertension Brother   . Cancer Neg Hx   . COPD Neg Hx   . Diabetes Neg Hx   . Stroke Neg Hx     Social History   Social History  . Marital status: Married    Spouse name: N/A  . Number of children: N/A  . Years of education: N/A   Occupational History  . Not on file.   Social History Main Topics  . Smoking status: Former Smoker    Packs/day: 0.25    Years: 5.00    Types: Cigarettes    Quit date: 06/04/2015  . Smokeless tobacco: Never Used  . Alcohol use No  . Drug use: No  . Sexual activity: Yes    Birth control/ protection: None   Other Topics Concern  . Not on file   Social History Narrative  . No narrative on file    Review of Systems: See HPI, otherwise negative  ROS  Physical Exam: BP 116/73   Pulse 77   Temp 97 F (36.1 C)   Resp 16   Ht 5\' 3"  (1.6 m)   Wt 199 lb (90.3 kg)   SpO2 99%   BMI 35.25 kg/m  General:   Alert,  pleasant and cooperative in NAD Head:  Normocephalic and atraumatic. Neck:  Supple; no masses or thyromegaly. Lungs:  Clear throughout to auscultation.    Heart:  Regular rate and rhythm. Abdomen:  Soft, nontender and nondistended. Normal bowel sounds, without guarding, and without rebound.   Neurologic:  Alert and  oriented x4;  grossly normal neurologically.  Impression/Plan: [name] is here for an colonoscopy to be performed for left sided pain  Risks, benefits, limitations, and alternatives regarding  colonoscopy have been reviewed with the  patient.  Questions have been answered.  All parties agreeable.   Jasmine Lame, MD  07/18/2016, 11:02 AM

## 2016-07-18 NOTE — Anesthesia Procedure Notes (Signed)
Procedure Name: MAC Performed by: Carlei Huang Pre-anesthesia Checklist: Patient identified, Emergency Drugs available, Suction available, Timeout performed and Patient being monitored Patient Re-evaluated:Patient Re-evaluated prior to inductionOxygen Delivery Method: Nasal cannula Placement Confirmation: positive ETCO2     

## 2016-07-18 NOTE — Transfer of Care (Signed)
Immediate Anesthesia Transfer of Care Note  Patient: Jasmine Tran  Procedure(s) Performed: Procedure(s): COLONOSCOPY WITH PROPOFOL (N/A)  Patient Location: PACU  Anesthesia Type: MAC  Level of Consciousness: awake, alert  and patient cooperative  Airway and Oxygen Therapy: Patient Spontanous Breathing and Patient connected to supplemental oxygen  Post-op Assessment: Post-op Vital signs reviewed, Patient's Cardiovascular Status Stable, Respiratory Function Stable, Patent Airway and No signs of Nausea or vomiting  Post-op Vital Signs: Reviewed and stable  Complications: No apparent anesthesia complications

## 2016-07-18 NOTE — Anesthesia Preprocedure Evaluation (Signed)
Anesthesia Evaluation  Patient identified by MRN, date of birth, ID band Patient awake    Reviewed: Allergy & Precautions, H&P , NPO status , Patient's Chart, lab work & pertinent test results, reviewed documented beta blocker date and time   Airway Mallampati: II  TM Distance: >3 FB Neck ROM: full    Dental no notable dental hx.    Pulmonary neg pulmonary ROS, former smoker,    Pulmonary exam normal breath sounds clear to auscultation       Cardiovascular Exercise Tolerance: Good negative cardio ROS   Rhythm:regular Rate:Normal     Neuro/Psych  Headaches, PSYCHIATRIC DISORDERS (bipolar disorder)    GI/Hepatic Neg liver ROS, GERD  ,  Endo/Other  negative endocrine ROS  Renal/GU negative Renal ROS  negative genitourinary   Musculoskeletal   Abdominal   Peds  Hematology negative hematology ROS (+)   Anesthesia Other Findings   Reproductive/Obstetrics negative OB ROS                             Anesthesia Physical Anesthesia Plan  ASA: II  Anesthesia Plan: MAC   Post-op Pain Management:    Induction:   Airway Management Planned:   Additional Equipment:   Intra-op Plan:   Post-operative Plan:   Informed Consent: I have reviewed the patients History and Physical, chart, labs and discussed the procedure including the risks, benefits and alternatives for the proposed anesthesia with the patient or authorized representative who has indicated his/her understanding and acceptance.   Dental Advisory Given  Plan Discussed with: CRNA  Anesthesia Plan Comments:         Anesthesia Quick Evaluation

## 2016-07-18 NOTE — Op Note (Signed)
[facility] Gastroenterology Patient Name: Jasmine Tran Procedure Date: 07/18/2016 11:42 AM MRN: [id number] Account #: [id number] Date of Birth: 30-May-1980 Admit Type: Outpatient Age: 36 Room: [facility] OR ROOM 01 Gender: Female Note Status: Finalized Procedure:            Colonoscopy Indications:          Abdominal pain in the left upper quadrant Providers:            Lucilla Lame MD, MD Referring MD:         Valerie Roys (Referring MD) Medicines:            Propofol per Anesthesia Complications:        No immediate complications. Procedure:            Pre-Anesthesia Assessment:                       - Prior to the procedure, a History and Physical was                        performed, and patient medications and allergies were                        reviewed. The patient's tolerance of previous                        anesthesia was also reviewed. The risks and benefits of                        the procedure and the sedation options and risks were                        discussed with the patient. All questions were                        answered, and informed consent was obtained. Prior                        Anticoagulants: The patient has taken no previous                        anticoagulant or antiplatelet agents. ASA Grade                        Assessment: II - A patient with mild systemic disease.                        After reviewing the risks and benefits, the patient was                        deemed in satisfactory condition to undergo the                        procedure.                       After obtaining informed consent, the colonoscope was                        passed under direct vision. Throughout the procedure,  the patient's blood pressure, pulse, and oxygen                        saturations were monitored continuously. The Olympus                        PCF H180AL Colonoscope (S#: [id number]) was introduced                 through the anus and advanced to the the terminal                        ileum. The colonoscopy was performed without                        difficulty. The patient tolerated the procedure well.                        The quality of the bowel preparation was excellent. Findings:      The perianal and digital rectal examinations were normal.      The terminal ileum appeared normal.      Non-bleeding internal hemorrhoids were found during retroflexion. The       hemorrhoids were Grade I (internal hemorrhoids that do not prolapse). Impression:           - The examined portion of the ileum was normal.                       - Non-bleeding internal hemorrhoids.                       - No specimens collected. Recommendation:       - Discharge patient to home.                       - Resume previous diet.                       - Continue present medications. Procedure Code(s):    --- Professional ---                       [phone], Colonoscopy, flexible; diagnostic, including                        collection of specimen(s) by brushing or washing, when                        performed (separate procedure) Diagnosis Code(s):    --- Professional ---                       R10.12, Left upper quadrant pain CPT copyright 2016 American Medical Association. All rights reserved. The codes documented in this report are preliminary and upon coder review may  be revised to meet current compliance requirements. Lucilla Lame MD, MD 07/18/2016 12:02:27 PM This report has been signed electronically. Number of Addenda: 0 Note Initiated On: 07/18/2016 11:42 AM Scope Withdrawal Time: 0 hours 7 minutes 0 seconds  Total Procedure Duration: 0 hours 9 minutes 49 seconds       [facility]

## 2016-07-21 ENCOUNTER — Encounter: Payer: Self-pay | Admitting: Family Medicine

## 2016-07-21 ENCOUNTER — Encounter: Payer: Self-pay | Admitting: Gastroenterology

## 2016-07-22 ENCOUNTER — Other Ambulatory Visit: Payer: Self-pay | Admitting: Family Medicine

## 2016-07-22 MED ORDER — NALTREXONE-BUPROPION HCL ER 8-90 MG PO TB12: ORAL_TABLET | ORAL | 2 refills | Status: DC

## 2016-07-24 ENCOUNTER — Encounter: Payer: Self-pay | Admitting: Gastroenterology

## 2016-07-28 ENCOUNTER — Other Ambulatory Visit: Payer: Self-pay

## 2016-07-28 ENCOUNTER — Encounter: Payer: Self-pay | Admitting: Family Medicine

## 2016-07-28 NOTE — Telephone Encounter (Signed)
I have not received a PA for this patient. 

## 2016-07-28 NOTE — Telephone Encounter (Signed)
None of those are appropriate for patient and I do not write them. Is there a PA?

## 2016-07-28 NOTE — Telephone Encounter (Signed)
I've got a note out to her to get that PA sent over. Await PA

## 2016-07-28 NOTE — Telephone Encounter (Signed)
[facility] sent a fax stating that patient Jasmine Tran is not covered by insurance. They state that the preferred alternative is benzphetamine HCL, diethylpropion HCL, or phentermine HCL. Are one of these appropriate for the patient?

## 2016-07-29 ENCOUNTER — Encounter: Payer: Self-pay | Admitting: Gastroenterology

## 2016-08-06 ENCOUNTER — Encounter: Payer: Self-pay | Admitting: Family Medicine

## 2016-08-06 MED ORDER — NORETHIN ACE-ETH ESTRAD-FE 1-20 MG-MCG PO TABS: 1.0000 | ORAL_TABLET | Freq: Every day | ORAL | 4 refills | Status: DC

## 2016-08-06 NOTE — Telephone Encounter (Signed)
Called appeals department of [employer].   I did in fact answer the question wrong, but question was worded differently and that's why I selected no. Even the representative said that the was a "tricky" question.  All that's needed is a letter from Dr. Wynetta Emery stating that the patient has tried and failed dietary restrictions, behavioral modifications, and exercise with no weight lost.  Tried calling patient. LVM for her to return my call.

## 2016-08-06 NOTE — Telephone Encounter (Signed)
Patient returned call. Notified patient about medications.  Explained we'd call her back once we found out something on her [facility] from pharmacy.

## 2016-08-06 NOTE — Telephone Encounter (Signed)
Attempted PA, Junel is a covered benefit.  Will try to call [facility] for an appeal on [employer].  I think this means that a provider has to call the pharmacy and authorize that it is okay to continue filling medication.

## 2016-08-06 NOTE — Telephone Encounter (Signed)
Patient called again regarding needing the PA on her BC-Junel.  She is getting very low on her [facility] and will need a different form if this cannot be done ASAP or if the PA is not approved.  [facility]   [phone] [facility]  Thanks

## 2016-08-06 NOTE — Telephone Encounter (Signed)
Called and spoke to [employer]. The tech stated that all they needed from [place] was a prescription for the junel. She stated that they have been faxing [place] but have not gotten a response. I apologized and told her that I have not seen any papers regarding this. Can we please send a prescription for the patient junel to [facility]?

## 2016-08-06 NOTE — Telephone Encounter (Signed)
Called and spoke to a pharmacy tech at [facility]. She states that a PA does not need to be done on the Junel. She stated that the insurance used to cover the medication completely but they are not covering it completely now. She stated that it was going to cost $18.87 per month for the patient where it used to not cost anything for the patient. She stated that this is happening with some of the [facility] now so she is not sure if changing it will help. Will call patient and see if she wants to see about changing medications or what she would like to do.

## 2016-08-06 NOTE — Telephone Encounter (Signed)
Called and let patient know that new rx was sent in to [facility] for her.

## 2016-08-06 NOTE — Telephone Encounter (Signed)
Called and spoke to patient. She said that the problem is not with [facility], it's with [facility]. I told her that the prescription was sent to [facility] but the patient states that [facility] got the [facility] from [facility]. Will try to call [facility] and see what is going on.

## 2016-08-11 ENCOUNTER — Telehealth: Payer: Self-pay | Admitting: Family Medicine

## 2016-08-11 NOTE — Telephone Encounter (Signed)
Called [employer]. Patient's PA had been denied as they said that she had not done diet and exercise for 3 months. She had done this. Called to find out why her PA had been denied. According to PA specialist she was denied because she hadn't failed diet and exercise. Discussed with them that she HAS failed diet and exercise for more than 3 months and that this was put in the PA. Disconnected. Called back.  Discussed issue with another representative. Transferred to appeals. Appeals does not have an outside phone number. Fax: [phone]  5:17PM- Spoke with Appeals. They stated that there was nothing that they could do over the phone and that they would fax over an appeals form.   I will be happy to fill this out when it gets here.

## 2016-08-14 ENCOUNTER — Ambulatory Visit: Payer: Self-pay | Admitting: Family Medicine

## 2016-08-14 ENCOUNTER — Ambulatory Visit: Payer: BLUE CROSS/BLUE SHIELD | Admitting: Family Medicine

## 2016-09-09 DIAGNOSIS — J0100 Acute maxillary sinusitis, unspecified: Secondary | ICD-10-CM | POA: Diagnosis not present

## 2016-09-09 DIAGNOSIS — N3001 Acute cystitis with hematuria: Secondary | ICD-10-CM | POA: Diagnosis not present

## 2016-10-28 ENCOUNTER — Encounter: Payer: Self-pay | Admitting: Family Medicine

## 2016-10-28 ENCOUNTER — Ambulatory Visit (INDEPENDENT_AMBULATORY_CARE_PROVIDER_SITE_OTHER): Payer: BLUE CROSS/BLUE SHIELD | Admitting: Family Medicine

## 2016-10-28 VITALS — BP 113/77 | HR 67 | Temp 97.6°F | Wt 186.4 lb

## 2016-10-28 DIAGNOSIS — D134 Benign neoplasm of liver: Secondary | ICD-10-CM | POA: Diagnosis not present

## 2016-10-28 DIAGNOSIS — F988 Other specified behavioral and emotional disorders with onset usually occurring in childhood and adolescence: Secondary | ICD-10-CM

## 2016-10-28 DIAGNOSIS — E282 Polycystic ovarian syndrome: Secondary | ICD-10-CM | POA: Diagnosis not present

## 2016-10-28 DIAGNOSIS — E669 Obesity, unspecified: Secondary | ICD-10-CM | POA: Diagnosis not present

## 2016-10-28 DIAGNOSIS — F3112 Bipolar disorder, current episode manic without psychotic features, moderate: Secondary | ICD-10-CM | POA: Diagnosis not present

## 2016-10-28 MED ORDER — DEPLIN 15 15-90.314 MG PO CAPS: 1.0000 | ORAL_CAPSULE | Freq: Every day | ORAL | 3 refills | Status: DC

## 2016-10-28 MED ORDER — OXCARBAZEPINE 600 MG PO TABS: 600.0000 mg | ORAL_TABLET | Freq: Two times a day (BID) | ORAL | 1 refills | Status: DC

## 2016-10-28 MED ORDER — LISDEXAMFETAMINE DIMESYLATE 50 MG PO CAPS: 50.0000 mg | ORAL_CAPSULE | Freq: Every day | ORAL | 0 refills | Status: DC

## 2016-10-28 MED ORDER — NORETHIN ACE-ETH ESTRAD-FE 1-20 MG-MCG PO TABS: 1.0000 | ORAL_TABLET | Freq: Every day | ORAL | 4 refills | Status: DC

## 2016-10-28 NOTE — Assessment & Plan Note (Signed)
Stable. Continue current regimen. Continue to monitor. Call with any concerns.  

## 2016-10-28 NOTE — Assessment & Plan Note (Signed)
Under good control. Continue current regimen. Continue to monitor. Call with any concerns. 3 month supply given today.

## 2016-10-28 NOTE — Assessment & Plan Note (Signed)
Congratulated patient on her 13lb weight loss. Continue diet and exercise. Continue to monitor.

## 2016-10-28 NOTE — Assessment & Plan Note (Signed)
Doing well on OCP- will check CMP. Call with any concerns.

## 2016-10-28 NOTE — Progress Notes (Signed)
BP 113/77 (BP Location: Left Arm, Patient Position: Sitting, Cuff Size: Large)   Pulse 67   Temp 97.6 F (36.4 C)   Wt 186 lb 6.4 oz (84.6 kg)   LMP 10/07/2016   SpO2 99%   BMI 33.02 kg/m    Subjective:    Patient ID: Jasmine Tran, female    DOB: 1981/02/20, 36 y.o.   MRN: [id number]  HPI: Jasmine Tran is a 36 y.o. female  Chief Complaint  Patient presents with  . ADHD  . Depression   Obesity- never got the contrave. Has been working out a lot and has been working on diet  Duration: chronic- much better in the last 3 months Previous attempts at weight loss: yes Complications of obesity: PCOS Weight loss goal: to be healthy Weight loss to date: 13 lbs  CONTRACEPTION CONCERNS Contraception: OCP Previous contraception: OCP  Sexual activity: monogamous Gravida/Para: G0P0 Average interval between menses: 28 days Length of menses: 5 days Flow: moderate Dysmenorrhea: no  ADHD FOLLOW UP ADHD status: controlled Satisfied with current therapy: yes Medication compliance:  good compliance Controlled substance contract: yes Previous psychiatry evaluation: yes Previous medications: yes    Taking meds on weekends/vacations: occasionally Work/school performance:  good Difficulty sustaining attention/completing tasks: no Distracted by extraneous stimuli: no Does not listen when spoken to: no  Fidgets with hands or feet: no Unable to stay in seat: no Blurts out/interrupts others: no ADHD Medication Side Effects: no    Decreased appetite: no    Headache: no    Sleeping disturbance pattern: no    Irritability: no    Rebound effects (worse than baseline) off medication: no    Anxiousness: no    Dizziness: no    Tics: no   Relevant past medical, surgical, family and social history reviewed and updated as indicated. Interim medical history since our last visit reviewed. Allergies and medications reviewed and updated.  Review of Systems  Constitutional: Negative.     Respiratory: Negative.   Cardiovascular: Negative.   Gastrointestinal: Negative.   Psychiatric/Behavioral: Negative.     Per HPI unless specifically indicated above     Objective:    BP 113/77 (BP Location: Left Arm, Patient Position: Sitting, Cuff Size: Large)   Pulse 67   Temp 97.6 F (36.4 C)   Wt 186 lb 6.4 oz (84.6 kg)   LMP 10/07/2016   SpO2 99%   BMI 33.02 kg/m   Wt Readings from Last 3 Encounters:  10/28/16 186 lb 6.4 oz (84.6 kg)  07/18/16 199 lb (90.3 kg)  07/17/16 202 lb (91.6 kg)    Physical Exam  Constitutional: She is oriented to person, place, and time. She appears well-developed and well-nourished. No distress.  HENT:  Head: Normocephalic and atraumatic.  Right Ear: Hearing normal.  Left Ear: Hearing normal.  Nose: Nose normal.  Eyes: Conjunctivae and lids are normal. Right eye exhibits no discharge. Left eye exhibits no discharge. No scleral icterus.  Cardiovascular: Normal rate, regular rhythm, normal heart sounds and intact distal pulses.  Exam reveals no gallop and no friction rub.   No murmur heard. Pulmonary/Chest: Effort normal and breath sounds normal. No respiratory distress. She has no wheezes. She has no rales. She exhibits no tenderness.  Abdominal: Soft. Bowel sounds are normal. She exhibits no distension and no mass. There is no tenderness. There is no rebound and no guarding.  Musculoskeletal: Normal range of motion.  Neurological: She is alert and oriented to person, place, and time.  Skin: Skin is warm, dry and intact. No rash noted. She is not diaphoretic. No erythema. No pallor.  Psychiatric: She has a normal mood and affect. Her speech is normal and behavior is normal. Judgment and thought content normal. Cognition and memory are normal.  Nursing note and vitals reviewed.   Results for orders placed or performed in visit on 07/15/16  Microscopic Examination  Result Value Ref Range   WBC, UA 0-5 0 - 5 /hpf   RBC, UA None seen 0 - 2  /hpf   Epithelial Cells (non renal) 0-10 0 - 10 /hpf   Mucus, UA Present (A) Not Estab.   Bacteria, UA Moderate (A) None seen/Few  UA/M w/rflx Culture, Routine (STAT)  Result Value Ref Range   Specific Gravity, UA 1.015 1.005 - 1.030   pH, UA 6.0 5.0 - 7.5   Color, UA Yellow Yellow   Appearance Ur Clear Clear   Leukocytes, UA Negative Negative   Protein, UA Trace (A) Negative/Trace   Glucose, UA Negative Negative   Ketones, UA Negative Negative   RBC, UA 1+ (A) Negative   Bilirubin, UA Negative Negative   Urobilinogen, Ur 0.2 0.2 - 1.0 mg/dL   Nitrite, UA Negative Negative   Microscopic Examination See below:    Urinalysis Reflex Comment   Urine Culture, Routine  Result Value Ref Range   Urine Culture, Routine Final report    Organism ID, Bacteria No growth       Assessment & Plan:   Problem List Items Addressed This Visit      Digestive   Adenoma of liver - Primary    Doing well on OCP- will check CMP. Call with any concerns.       Relevant Orders   Comprehensive metabolic panel     Endocrine   PCOS (polycystic ovarian syndrome)    Doing well on OCP- will check CMP. Call with any concerns.         Other   Bipolar 1 disorder with moderate mania (HCC)    Stable. Continue current regimen. Continue to monitor. Call with any concerns.       ADD (attention deficit disorder)    Under good control. Continue current regimen. Continue to monitor. Call with any concerns. 3 month supply given today.      Obesity (BMI 30-39.9)    Congratulated patient on her 13lb weight loss. Continue diet and exercise. Continue to monitor.       Relevant Medications   lisdexamfetamine (VYVANSE) 50 MG capsule       Follow up plan: Return in about 3 months (around 01/28/2017) for Follow up ADHD.

## 2016-10-29 LAB — COMPREHENSIVE METABOLIC PANEL
ALT: 20 IU/L (ref 0–32)
AST: 22 IU/L (ref 0–40)
Albumin/Globulin Ratio: 1.6 (ref 1.2–2.2)
Albumin: 4.3 g/dL (ref 3.5–5.5)
Alkaline Phosphatase: 76 IU/L (ref 39–117)
BUN/Creatinine Ratio: 10 (ref 9–23)
BUN: 7 mg/dL (ref 6–20)
Bilirubin Total: 0.3 mg/dL (ref 0.0–1.2)
CO2: 21 mmol/L (ref 20–29)
CREATININE: 0.73 mg/dL (ref 0.57–1.00)
Calcium: 9.2 mg/dL (ref 8.7–10.2)
Chloride: 102 mmol/L (ref 96–106)
GFR calc Af Amer: 124 mL/min/{1.73_m2} (ref >59)
GFR, EST NON AFRICAN AMERICAN: 108 mL/min/{1.73_m2} (ref >59)
GLOBULIN, TOTAL: 2.7 g/dL (ref 1.5–4.5)
Glucose: 100 mg/dL — ABNORMAL HIGH (ref 65–99)
Potassium: 4.3 mmol/L (ref 3.5–5.2)
SODIUM: 138 mmol/L (ref 134–144)
Total Protein: 7 g/dL (ref 6.0–8.5)

## 2016-11-10 ENCOUNTER — Encounter: Payer: Self-pay | Admitting: Family Medicine

## 2016-11-26 ENCOUNTER — Telehealth: Payer: Self-pay | Admitting: Internal Medicine

## 2016-11-26 NOTE — Telephone Encounter (Signed)
3 attempts to schedule fu appt from recall list.   Deleting recall.   

## 2016-12-04 ENCOUNTER — Emergency Department
Admission: EM | Admit: 2016-12-04 | Discharge: 2016-12-05 | Disposition: A | Discharge status: Home / Self Care | Payer: BLUE CROSS/BLUE SHIELD | Attending: Emergency Medicine | Admitting: Emergency Medicine

## 2016-12-04 DIAGNOSIS — K5900 Constipation, unspecified: Secondary | ICD-10-CM | POA: Diagnosis not present

## 2016-12-04 DIAGNOSIS — Z79899 Other long term (current) drug therapy: Secondary | ICD-10-CM | POA: Insufficient documentation

## 2016-12-04 DIAGNOSIS — R109 Unspecified abdominal pain: Secondary | ICD-10-CM | POA: Diagnosis not present

## 2016-12-04 DIAGNOSIS — Z87891 Personal history of nicotine dependence: Secondary | ICD-10-CM | POA: Insufficient documentation

## 2016-12-04 DIAGNOSIS — R1011 Right upper quadrant pain: Secondary | ICD-10-CM | POA: Insufficient documentation

## 2016-12-04 LAB — CBC
HEMATOCRIT: 37.5 % (ref 35.0–47.0)
HEMOGLOBIN: 13.2 g/dL (ref 12.0–16.0)
MCH: 31.3 pg (ref 26.0–34.0)
MCHC: 35.2 g/dL (ref 32.0–36.0)
MCV: 89 fL (ref 80.0–100.0)
Platelets: 273 10*3/uL (ref 150–440)
RBC: 4.21 MIL/uL (ref 3.80–5.20)
RDW: 13.4 % (ref 11.5–14.5)
WBC: 8.1 10*3/uL (ref 3.6–11.0)

## 2016-12-04 LAB — HEPATIC FUNCTION PANEL
ALK PHOS: 66 U/L (ref 38–126)
ALT: 16 U/L (ref 14–54)
AST: 20 U/L (ref 15–41)
Albumin: 4.1 g/dL (ref 3.5–5.0)
TOTAL PROTEIN: 7.6 g/dL (ref 6.5–8.1)
Total Bilirubin: 0.5 mg/dL (ref 0.3–1.2)

## 2016-12-04 LAB — URINALYSIS, COMPLETE (UACMP) WITH MICROSCOPIC
BILIRUBIN URINE: NEGATIVE
GLUCOSE, UA: NEGATIVE mg/dL
KETONES UR: NEGATIVE mg/dL
LEUKOCYTES UA: NEGATIVE
NITRITE: NEGATIVE
PROTEIN: NEGATIVE mg/dL
Specific Gravity, Urine: 1.028 (ref 1.005–1.030)
pH: 5 (ref 5.0–8.0)

## 2016-12-04 LAB — LIPASE, BLOOD: LIPASE: 35 U/L (ref 11–51)

## 2016-12-04 LAB — POCT PREGNANCY, URINE: PREG TEST UR: NEGATIVE

## 2016-12-04 NOTE — ED Triage Notes (Signed)
Pt presents to ED via POV with c/o RUQ pain with radiation into the RIGHT flank x3 days with increasing intensity today. Pt reports "Liver resection" in Jan of 2017 and states she was told by her PCP to come to the ED for evaluation. Pt reports N/V, but denies diarrhea. Pt denies changes in urinary frequency or quality. Pt is A&O, in NAD; RR even, regular, and unlabored.

## 2016-12-05 ENCOUNTER — Ambulatory Visit: Payer: BLUE CROSS/BLUE SHIELD | Admitting: Family Medicine

## 2016-12-05 ENCOUNTER — Emergency Department: Payer: BLUE CROSS/BLUE SHIELD

## 2016-12-05 DIAGNOSIS — R1011 Right upper quadrant pain: Secondary | ICD-10-CM | POA: Diagnosis not present

## 2016-12-05 LAB — BASIC METABOLIC PANEL
ANION GAP: 7 (ref 5–15)
BUN: 9 mg/dL (ref 6–20)
CO2: 24 mmol/L (ref 22–32)
Calcium: 9.2 mg/dL (ref 8.9–10.3)
Chloride: 109 mmol/L (ref 101–111)
Creatinine, Ser: 0.79 mg/dL (ref 0.44–1.00)
GFR calc Af Amer: >60 mL/min (ref >60)
Glucose, Bld: 95 mg/dL (ref 65–99)
POTASSIUM: 3.7 mmol/L (ref 3.5–5.1)
SODIUM: 140 mmol/L (ref 135–145)

## 2016-12-05 MED ORDER — IOPAMIDOL (ISOVUE-300) INJECTION 61%
30.0000 mL | Freq: Once | INTRAVENOUS | Status: AC
  Administered 2016-12-05: 30 mL via ORAL

## 2016-12-05 MED ORDER — IOPAMIDOL (ISOVUE-300) INJECTION 61%
100.0000 mL | Freq: Once | INTRAVENOUS | Status: AC | PRN
  Administered 2016-12-05: 100 mL via INTRAVENOUS

## 2016-12-05 MED ORDER — ONDANSETRON HCL 4 MG/2ML IJ SOLN
4.0000 mg | Freq: Once | INTRAMUSCULAR | Status: AC
  Administered 2016-12-05: 4 mg via INTRAVENOUS
  Filled 2016-12-05: qty 2

## 2016-12-05 MED ORDER — HYDROMORPHONE HCL 1 MG/ML IJ SOLN
INTRAMUSCULAR | Status: AC
  Filled 2016-12-05: qty 1

## 2016-12-05 MED ORDER — HYDROMORPHONE HCL 1 MG/ML IJ SOLN
1.0000 mg | Freq: Once | INTRAMUSCULAR | Status: AC
  Administered 2016-12-05: 1 mg via INTRAVENOUS
  Filled 2016-12-05: qty 1

## 2016-12-05 MED ORDER — HYDROMORPHONE HCL 1 MG/ML IJ SOLN
0.5000 mg | Freq: Once | INTRAMUSCULAR | Status: AC
  Administered 2016-12-05: 0.5 mg via INTRAVENOUS

## 2016-12-05 MED ORDER — POLYETHYLENE GLYCOL 3350 17 GM/SCOOP PO POWD: 17.0000 g | Freq: Every day | ORAL | 0 refills | Status: DC

## 2016-12-05 MED ORDER — TRAMADOL HCL 50 MG PO TABS: 50.0000 mg | ORAL_TABLET | Freq: Four times a day (QID) | ORAL | 0 refills | Status: DC | PRN

## 2016-12-05 NOTE — ED Provider Notes (Signed)
[facility] Emergency Department Provider Note  Time seen: 12:24 AM  I have reviewed the triage vital signs and the nursing notes.   HISTORY  Chief Complaint Abdominal Pain    HPI Jasmine Tran is a 36 y.o. female with a past medical history of bipolar, PCO2 S, prior liver resection for adenoma, who presents to the emergency department for right upper quadrant abdominal pain. According to the patient for the past 2-3 weeks she has been experiencing intermittent right upper quadrant pain/right flank pain. Describes as a fullness type pain but occasionally dull and aching. Moderate in severity including currently. Patient is status post cholecystectomy, status post appendectomy. Patient states 1.5 years ago she had a partial liver resection due to to benign adenomas. She talked to her surgeon at [facility] who recommended they code to the ER for evaluation. Patient states intermittent nausea but denies any vomiting. Denies diarrhea. Denies dysuria or hematuria. Denies vaginal bleeding or discharge.  Past Medical History:  Diagnosis Date  . ADD (attention deficit disorder)   . Bipolar 1 disorder with moderate mania ([place])   . GERD (gastroesophageal reflux disease)     Patient Active Problem List   Diagnosis Date Noted  . Abdominal pain, left upper quadrant   . Heartburn   . Anxiety 06/16/2015  . PCOS (polycystic ovarian syndrome) 04/19/2015  . Adenoma of liver 04/16/2015  . Hematuria 03/30/2015  . Bipolar 1 disorder with moderate mania ([place])   . ADD (attention deficit disorder)   . Carpal tunnel syndrome, right 07/21/2013  . Allergic rhinitis 03/10/2013  . Migraine 03/10/2013  . Obesity (BMI 30-39.9) 03/10/2013  . Tobacco use 03/10/2013    Past Surgical History:  Procedure Laterality Date  . APPENDECTOMY    . CHOLECYSTECTOMY    . COLONOSCOPY WITH PROPOFOL N/A 07/18/2016   Procedure: COLONOSCOPY WITH PROPOFOL;  Surgeon: Lucilla Lame, MD;  Location: [place] [facility];  Service: Endoscopy;  Laterality: N/A;  . ESOPHAGOGASTRODUODENOSCOPY (EGD) WITH PROPOFOL N/A 07/17/2016   Procedure: ESOPHAGOGASTRODUODENOSCOPY (EGD) WITH PROPOFOL;  Surgeon: Lucilla Lame, MD;  Location: [facility];  Service: Endoscopy;  Laterality: N/A;  . OPEN HEPATECTOMY   06/04/2015   Liver adenoma removal  . TUMOR REMOVAL     Liver, benign    Prior to Admission medications   Medication Sig Start Date End Date Taking? Authorizing Provider  EPINEPHrine (EPIPEN 2-PAK) 0.3 mg/0.3 mL IJ SOAJ injection Inject 0.3 mLs (0.3 mg total) into the muscle once. 10/23/15   Johnson, Megan P, DO  famotidine (PEPCID) 20 MG tablet Take 1 tablet (20 mg total) by mouth 2 (two) times daily. 04/02/16   End, Harrell Gave, MD  L-Methylfolate-Algae (DEPLIN 15) 15-90.314 MG CAPS Take 1 tablet by mouth daily. 10/28/16   Johnson, Megan P, DO  lamoTRIgine (LAMICTAL) 200 MG tablet Take 1 tablet (200 mg total) by mouth daily. 05/15/16   Johnson, Megan P, DO  lisdexamfetamine (VYVANSE) 50 MG capsule Take 1 capsule (50 mg total) by mouth daily. 10/28/16   Park Liter P, DO  norethindrone-ethinyl estradiol (JUNEL FE 1/20) 1-20 MG-MCG tablet Take 1 tablet by mouth daily. 10/28/16   Johnson, Megan P, DO  oxcarbazepine (TRILEPTAL) 600 MG tablet Take 1 tablet (600 mg total) by mouth 2 (two) times daily. 10/28/16   Park Liter P, DO    Allergies  Allergen Reactions  . Tape Dermatitis    (tegaderm OK)  . Doxycycline Nausea And Vomiting  . Morphine And Related Hives  . Phenergan [Promethazine  Hcl] Nausea And Vomiting  . Sertraline     Other reaction(s): NAUSEA, PUPILS DILATE  . Prednisone Nausea And Vomiting    Family History  Problem Relation Age of Onset  . Heart disease Mother   . Heart disease Father   . Hypertension Brother   . Cancer Neg Hx   . COPD Neg Hx   . Diabetes Neg Hx   . Stroke Neg Hx     Social History Social History  Substance Use Topics  . Smoking status: Former Smoker     Packs/day: 0.25    Years: 5.00    Types: Cigarettes    Quit date: 06/04/2015  . Smokeless tobacco: Never Used  . Alcohol use No    Review of Systems Constitutional: Negative for fever. Cardiovascular: Negative for chest pain. Respiratory: Negative for shortness of breath. Gastrointestinal: Positive for moderate right sided abdominal pain/right upper quadrant pain. Positive for nausea. Negative for vomiting or diarrhea. Genitourinary: Negative for dysuria. Negative hematuria. Negative for vaginal bleeding or discharge. Musculoskeletal: Negative for back pain Neurological: Negative for headache All other ROS negative  ____________________________________________   PHYSICAL EXAM:  VITAL SIGNS: ED Triage Vitals  Enc Vitals Group     BP 12/04/16 2030 127/83     Pulse Rate 12/04/16 2030 78     Resp 12/04/16 2030 18     Temp 12/04/16 2030 98.9 F (37.2 C)     Temp Source 12/04/16 2030 Oral     SpO2 12/04/16 2030 100 %     Weight 12/04/16 2024 180 lb (81.6 kg)     Height 12/04/16 2024 5\' 3"  (1.6 m)     Head Circumference --      Peak Flow --      Pain Score 12/04/16 2024 7     Pain Loc --      Pain Edu? --      Excl. in [place]? --     Constitutional: Alert and oriented. Well appearing and in no distress. Eyes: Normal exam ENT   Head: Normocephalic and atraumatic.   Mouth/Throat: Mucous membranes are moist. Cardiovascular: Normal rate, regular rhythm. No murmur Respiratory: Normal respiratory effort without tachypnea nor retractions. Breath sounds are clear  Gastrointestinal: Soft, moderate right upper quadrant tenderness. No rebound or guarding. No distention. Musculoskeletal: Nontender with normal range of motion in all extremities.  Neurologic:  Normal speech and language. No gross focal neurologic deficits Skin:  Skin is warm, dry and intact.  Psychiatric: Mood and affect are normal.  ____________________________________________     RADIOLOGY  CT scan  positive for constipation otherwise negative.  ____________________________________________   INITIAL IMPRESSION / ASSESSMENT AND PLAN / ED COURSE  Pertinent labs & imaging results that were available during my care of the patient were reviewed by me and considered in my medical decision making (see chart for details).  Patient present emergency department for 2 weeks of right upper quadrant pain which is getting progressively worse. History of a liver resection 1.5 years ago for benign adenoma. Patient's labs are largely within normal limits, BMP is pending. Liver function tests and lipase are normal. Patient is status post cholecystectomy, status post appendectomy. Given the patient's history of a partial liver resection we will obtain a CT the abdomen/pelvis to further evaluate. Overall the patient appears well, no acute distress, calm and cooperative.  CT scan positive for constipation otherwise negative. We'll discharge with a short course of tramadol and MiraLAX. Patient will follow up with her doctor.  ____________________________________________   FINAL CLINICAL IMPRESSION(S) / ED DIAGNOSES  Right upper quadrant abdominal pain Constipation   Harvest Dark, MD 12/05/16 [phone]

## 2016-12-28 ENCOUNTER — Other Ambulatory Visit: Payer: Self-pay | Admitting: Family Medicine

## 2017-02-17 DIAGNOSIS — R590 Localized enlarged lymph nodes: Secondary | ICD-10-CM | POA: Diagnosis not present

## 2017-02-17 DIAGNOSIS — H60501 Unspecified acute noninfective otitis externa, right ear: Secondary | ICD-10-CM | POA: Diagnosis not present

## 2017-02-19 ENCOUNTER — Ambulatory Visit (INDEPENDENT_AMBULATORY_CARE_PROVIDER_SITE_OTHER): Payer: BLUE CROSS/BLUE SHIELD | Admitting: Family Medicine

## 2017-02-19 ENCOUNTER — Encounter: Payer: Self-pay | Admitting: Family Medicine

## 2017-02-19 VITALS — BP 118/79 | HR 67 | Temp 97.5°F | Wt 178.5 lb

## 2017-02-19 DIAGNOSIS — R590 Localized enlarged lymph nodes: Secondary | ICD-10-CM

## 2017-02-19 DIAGNOSIS — F988 Other specified behavioral and emotional disorders with onset usually occurring in childhood and adolescence: Secondary | ICD-10-CM | POA: Diagnosis not present

## 2017-02-19 MED ORDER — LISDEXAMFETAMINE DIMESYLATE 50 MG PO CAPS: 50.0000 mg | ORAL_CAPSULE | Freq: Every day | ORAL | 0 refills | Status: DC

## 2017-02-19 MED ORDER — AMOXICILLIN-POT CLAVULANATE 875-125 MG PO TABS: 1.0000 | ORAL_TABLET | Freq: Two times a day (BID) | ORAL | 0 refills | Status: DC

## 2017-02-19 NOTE — Progress Notes (Signed)
BP 118/79 (BP Location: Left Arm, Patient Position: Sitting, Cuff Size: Normal)   Pulse 67   Temp (!) 97.5 F (36.4 C)   Wt 178 lb 8 oz (81 kg)   LMP 02/13/2017 (Exact Date)   SpO2 99%   BMI 31.62 kg/m    Subjective:    Patient ID: Albertine Grates, female    DOB: 1980/10/09, 36 y.o.   MRN: [id number]  HPI: Nadirah Socorro is a 36 y.o. female  Chief Complaint  Patient presents with  . Ear Pain    Right  . Cough  . Sore Throat   UPPER RESPIRATORY TRACT INFECTION- went to [facility] on Monday and diagnosed with otitis externa Duration: 4 days Worst symptom: Ear pain Fever: no Cough: yes Shortness of breath: no Wheezing: no Chest pain: no Chest tightness: no Chest congestion: no Nasal congestion: no Runny nose: no Post nasal drip: no Sneezing: no Sore throat: yes Swollen glands: yes Sinus pressure: no Headache: yes Face pain: no Toothache: no Ear pain: yes "right Ear pressure: yes "right Eyes red/itching:no Eye drainage/crusting: no  Vomiting: no Rash: no Fatigue: yes Sick contacts: no Strep contacts: no  Context: worse Recurrent sinusitis: no Relief with OTC cold/cough medications: no  Treatments attempted: none   ADHD FOLLOW UP ADHD status: controlled Satisfied with current therapy: yes Medication compliance:  excellent compliance Controlled substance contract: yes Previous psychiatry evaluation: yes Previous medications: yes    Taking meds on weekends/vacations: yes Work/school performance:  good Difficulty sustaining attention/completing tasks: no Distracted by extraneous stimuli: no Does not listen when spoken to: no  Fidgets with hands or feet: no Unable to stay in seat: no Blurts out/interrupts others: no ADHD Medication Side Effects: no    Decreased appetite: no    Headache: no    Sleeping disturbance pattern: no    Irritability: no    Rebound effects (worse than baseline) off medication: no    Anxiousness: no    Dizziness: no  Tics: no   Relevant past medical, surgical, family and social history reviewed and updated as indicated. Interim medical history since our last visit reviewed. Allergies and medications reviewed and updated.  Review of Systems  Constitutional: Negative.   HENT: Positive for congestion and ear pain. Negative for dental problem, drooling, ear discharge, facial swelling, hearing loss, mouth sores, nosebleeds, postnasal drip, rhinorrhea, sinus pain, sinus pressure, sneezing, sore throat, tinnitus, trouble swallowing and voice change.   Respiratory: Negative.   Cardiovascular: Negative.   Psychiatric/Behavioral: Negative.     Per HPI unless specifically indicated above     Objective:    BP 118/79 (BP Location: Left Arm, Patient Position: Sitting, Cuff Size: Normal)   Pulse 67   Temp (!) 97.5 F (36.4 C)   Wt 178 lb 8 oz (81 kg)   LMP 02/13/2017 (Exact Date)   SpO2 99%   BMI 31.62 kg/m   Wt Readings from Last 3 Encounters:  02/19/17 178 lb 8 oz (81 kg)  12/04/16 180 lb (81.6 kg)  10/28/16 186 lb 6.4 oz (84.6 kg)    Physical Exam  Constitutional: She is oriented to person, place, and time. She appears well-developed and well-nourished. No distress.  HENT:  Head: Normocephalic and atraumatic.    Right Ear: Hearing and external ear normal.  Left Ear: Hearing and external ear normal.  Nose: Nose normal.  Mouth/Throat: Oropharynx is clear and moist. No oropharyngeal exudate.  Eyes: Pupils are equal, round, and reactive to light. Conjunctivae, EOM  and lids are normal. Right eye exhibits no discharge. Left eye exhibits no discharge. No scleral icterus.  Neck: Normal range of motion. Neck supple. No JVD present. No tracheal deviation present. No thyromegaly present.  Cardiovascular: Normal rate, regular rhythm, normal heart sounds and intact distal pulses.  Exam reveals no gallop and no friction rub.   No murmur heard. Pulmonary/Chest: Effort normal and breath sounds normal. No  stridor. No respiratory distress. She has no wheezes. She has no rales. She exhibits no tenderness.  Musculoskeletal: Normal range of motion.  Lymphadenopathy:    She has no cervical adenopathy.  Neurological: She is alert and oriented to person, place, and time.  Skin: Skin is warm, dry and intact. No rash noted. She is not diaphoretic. No erythema. No pallor.  Psychiatric: She has a normal mood and affect. Her speech is normal and behavior is normal. Judgment and thought content normal. Cognition and memory are normal.  Nursing note and vitals reviewed.   Results for orders placed or performed during the hospital encounter of 12/04/16  Lipase, blood  Result Value Ref Range   Lipase 35 11 - 51 U/L  CBC  Result Value Ref Range   WBC 8.1 3.6 - 11.0 K/uL   RBC 4.21 3.80 - 5.20 MIL/uL   Hemoglobin 13.2 12.0 - 16.0 g/dL   HCT 37.5 35.0 - 47.0 %   MCV 89.0 80.0 - 100.0 fL   MCH 31.3 26.0 - 34.0 pg   MCHC 35.2 32.0 - 36.0 g/dL   RDW 13.4 11.5 - 14.5 %   Platelets 273 150 - 440 K/uL  Urinalysis, Complete w Microscopic  Result Value Ref Range   Color, Urine YELLOW (A) YELLOW   APPearance CLEAR (A) CLEAR   Specific Gravity, Urine 1.028 1.005 - 1.030   pH 5.0 5.0 - 8.0   Glucose, UA NEGATIVE NEGATIVE mg/dL   Hgb urine dipstick SMALL (A) NEGATIVE   Bilirubin Urine NEGATIVE NEGATIVE   Ketones, ur NEGATIVE NEGATIVE mg/dL   Protein, ur NEGATIVE NEGATIVE mg/dL   Nitrite NEGATIVE NEGATIVE   Leukocytes, UA NEGATIVE NEGATIVE   RBC / HPF 0-5 0 - 5 RBC/hpf   WBC, UA 0-5 0 - 5 WBC/hpf   Bacteria, UA RARE (A) NONE SEEN   Squamous Epithelial / LPF 0-5 (A) NONE SEEN   Mucus PRESENT   Hepatic function panel  Result Value Ref Range   Total Protein 7.6 6.5 - 8.1 g/dL   Albumin 4.1 3.5 - 5.0 g/dL   AST 20 15 - 41 U/L   ALT 16 14 - 54 U/L   Alkaline Phosphatase 66 38 - 126 U/L   Total Bilirubin 0.5 0.3 - 1.2 mg/dL   Bilirubin, Direct <0.1 (L) 0.1 - 0.5 mg/dL   Indirect Bilirubin NOT CALCULATED  0.3 - 0.9 mg/dL  Basic metabolic panel  Result Value Ref Range   Sodium 140 135 - 145 mmol/L   Potassium 3.7 3.5 - 5.1 mmol/L   Chloride 109 101 - 111 mmol/L   CO2 24 22 - 32 mmol/L   Glucose, Bld 95 65 - 99 mg/dL   BUN 9 6 - 20 mg/dL   Creatinine, Ser 0.79 0.44 - 1.00 mg/dL   Calcium 9.2 8.9 - 10.3 mg/dL   GFR calc non Af Amer >60 >60 mL/min   GFR calc Af Amer >60 >60 mL/min   Anion gap 7 5 - 15  Pregnancy, urine POC  Result Value Ref Range   Preg Test, Ur  NEGATIVE NEGATIVE      Assessment & Plan:   Problem List Items Addressed This Visit      Other   ADD (attention deficit disorder)    Stable on current regimen. Will continue current regimen. Recheck 3 months.        Other Visit Diagnoses    Posterior auricular lymphadenopathy    -  Primary   Will treat with augmentin. Warning signs for mastoiditis discussed. Call if not improving or worsening.        Follow up plan: Return in about 3 months (around 05/22/2017) for follow up ADD.

## 2017-02-19 NOTE — Assessment & Plan Note (Signed)
Stable on current regimen. Will continue current regimen. Recheck 3 months.

## 2017-02-23 ENCOUNTER — Telehealth: Payer: Self-pay | Admitting: Family Medicine

## 2017-02-23 ENCOUNTER — Encounter: Payer: Self-pay | Admitting: Family Medicine

## 2017-02-23 NOTE — Telephone Encounter (Signed)
Called to check on area behind her ear. Mail box was full and unable to leave a message. Will send mychart message.

## 2017-02-23 NOTE — Telephone Encounter (Signed)
-----   Message from Valerie Roys, DO sent at 02/19/2017  9:27 AM EDT ----- Call to check on area behind her ear

## 2017-03-21 DIAGNOSIS — S99921A Unspecified injury of right foot, initial encounter: Secondary | ICD-10-CM | POA: Diagnosis not present

## 2017-03-21 DIAGNOSIS — S82831A Other fracture of upper and lower end of right fibula, initial encounter for closed fracture: Secondary | ICD-10-CM | POA: Diagnosis not present

## 2017-03-21 DIAGNOSIS — S82839A Other fracture of upper and lower end of unspecified fibula, initial encounter for closed fracture: Secondary | ICD-10-CM | POA: Diagnosis not present

## 2017-03-21 DIAGNOSIS — Y33XXXA Other specified events, undetermined intent, initial encounter: Secondary | ICD-10-CM | POA: Diagnosis not present

## 2017-03-21 DIAGNOSIS — S8251XA Displaced fracture of medial malleolus of right tibia, initial encounter for closed fracture: Secondary | ICD-10-CM | POA: Diagnosis not present

## 2017-03-31 DIAGNOSIS — M25571 Pain in right ankle and joints of right foot: Secondary | ICD-10-CM | POA: Diagnosis not present

## 2017-03-31 DIAGNOSIS — S92191A Other fracture of right talus, initial encounter for closed fracture: Secondary | ICD-10-CM | POA: Diagnosis not present

## 2017-03-31 DIAGNOSIS — M79671 Pain in right foot: Secondary | ICD-10-CM | POA: Diagnosis not present

## 2017-04-02 DIAGNOSIS — S92191A Other fracture of right talus, initial encounter for closed fracture: Secondary | ICD-10-CM | POA: Diagnosis not present

## 2017-04-07 DIAGNOSIS — M25571 Pain in right ankle and joints of right foot: Secondary | ICD-10-CM | POA: Diagnosis not present

## 2017-04-07 DIAGNOSIS — S9701XS Crushing injury of right ankle, sequela: Secondary | ICD-10-CM | POA: Diagnosis not present

## 2017-04-07 DIAGNOSIS — M65871 Other synovitis and tenosynovitis, right ankle and foot: Secondary | ICD-10-CM | POA: Diagnosis not present

## 2017-04-16 ENCOUNTER — Other Ambulatory Visit: Payer: Self-pay | Admitting: Family Medicine

## 2017-04-22 ENCOUNTER — Telehealth: Payer: Self-pay

## 2017-04-22 MED ORDER — EPINEPHRINE 0.3 MG/0.3ML IJ SOAJ
0.3000 mg | Freq: Once | INTRAMUSCULAR | 12 refills | Status: AC
Start: 2017-04-22 — End: 2017-04-22

## 2017-04-22 NOTE — Telephone Encounter (Signed)
Refill on epi pen

## 2017-04-24 ENCOUNTER — Other Ambulatory Visit: Payer: Self-pay

## 2017-06-23 ENCOUNTER — Ambulatory Visit (INDEPENDENT_AMBULATORY_CARE_PROVIDER_SITE_OTHER): Payer: BLUE CROSS/BLUE SHIELD | Admitting: Family Medicine

## 2017-06-23 ENCOUNTER — Encounter: Payer: Self-pay | Admitting: Family Medicine

## 2017-06-23 VITALS — BP 108/71 | HR 69 | Temp 98.3°F | Ht 63.3 in | Wt 192.4 lb

## 2017-06-23 DIAGNOSIS — Z124 Encounter for screening for malignant neoplasm of cervix: Secondary | ICD-10-CM

## 2017-06-23 DIAGNOSIS — Z0289 Encounter for other administrative examinations: Secondary | ICD-10-CM

## 2017-06-23 DIAGNOSIS — F3112 Bipolar disorder, current episode manic without psychotic features, moderate: Secondary | ICD-10-CM

## 2017-06-23 DIAGNOSIS — Z0000 Encounter for general adult medical examination without abnormal findings: Secondary | ICD-10-CM | POA: Diagnosis not present

## 2017-06-23 DIAGNOSIS — N898 Other specified noninflammatory disorders of vagina: Secondary | ICD-10-CM

## 2017-06-23 DIAGNOSIS — D134 Benign neoplasm of liver: Secondary | ICD-10-CM | POA: Diagnosis not present

## 2017-06-23 DIAGNOSIS — F419 Anxiety disorder, unspecified: Secondary | ICD-10-CM

## 2017-06-23 DIAGNOSIS — F988 Other specified behavioral and emotional disorders with onset usually occurring in childhood and adolescence: Secondary | ICD-10-CM | POA: Diagnosis not present

## 2017-06-23 DIAGNOSIS — Z720 Tobacco use: Secondary | ICD-10-CM

## 2017-06-23 DIAGNOSIS — Z87891 Personal history of nicotine dependence: Secondary | ICD-10-CM

## 2017-06-23 DIAGNOSIS — R319 Hematuria, unspecified: Secondary | ICD-10-CM

## 2017-06-23 DIAGNOSIS — Z01411 Encounter for gynecological examination (general) (routine) with abnormal findings: Secondary | ICD-10-CM | POA: Diagnosis not present

## 2017-06-23 DIAGNOSIS — E282 Polycystic ovarian syndrome: Secondary | ICD-10-CM

## 2017-06-23 DIAGNOSIS — G43909 Migraine, unspecified, not intractable, without status migrainosus: Secondary | ICD-10-CM | POA: Diagnosis not present

## 2017-06-23 LAB — MICROSCOPIC EXAMINATION: Bacteria, UA: NONE SEEN

## 2017-06-23 LAB — UA/M W/RFLX CULTURE, ROUTINE
Bilirubin, UA: NEGATIVE
GLUCOSE, UA: NEGATIVE
KETONES UA: NEGATIVE
Leukocytes, UA: NEGATIVE
Nitrite, UA: NEGATIVE
PROTEIN UA: NEGATIVE
SPEC GRAV UA: 1.015 (ref 1.005–1.030)
Urobilinogen, Ur: 0.2 mg/dL (ref 0.2–1.0)
pH, UA: 5.5 (ref 5.0–7.5)

## 2017-06-23 LAB — WET PREP FOR TRICH, YEAST, CLUE
Clue Cell Exam: NEGATIVE
TRICHOMONAS EXAM: NEGATIVE
Yeast Exam: NEGATIVE

## 2017-06-23 LAB — BAYER DCA HB A1C WAIVED: HB A1C: 5 % (ref <7.0)

## 2017-06-23 MED ORDER — OXCARBAZEPINE 600 MG PO TABS: 600.0000 mg | ORAL_TABLET | Freq: Two times a day (BID) | ORAL | 1 refills | Status: DC

## 2017-06-23 MED ORDER — NORETHINDRONE ACET-ETHINYL EST 1-20 MG-MCG PO TABS: 1.0000 | ORAL_TABLET | Freq: Every day | ORAL | 4 refills | Status: DC

## 2017-06-23 MED ORDER — ATOMOXETINE HCL 40 MG PO CAPS: ORAL_CAPSULE | ORAL | 3 refills | Status: DC

## 2017-06-23 MED ORDER — DEPLIN 15 15-90.314 MG PO CAPS: 1.0000 | ORAL_CAPSULE | Freq: Every day | ORAL | 3 refills | Status: DC

## 2017-06-23 MED ORDER — LAMOTRIGINE 200 MG PO TABS: 200.0000 mg | ORAL_TABLET | Freq: Every day | ORAL | 1 refills | Status: DC

## 2017-06-23 NOTE — Assessment & Plan Note (Signed)
Stable. Continue current regimen. Continue to monitor. Call with any concerns.  

## 2017-06-23 NOTE — Progress Notes (Signed)
BP 108/71 (BP Location: Left Arm, Patient Position: Sitting, Cuff Size: Large)   Pulse 69   Temp 98.3 F (36.8 C)   Ht 5' 3.3" (1.608 m)   Wt 192 lb 6 oz (87.3 kg)   SpO2 99%   BMI 33.76 kg/m    Subjective:    Patient ID: Jasmine Tran, female    DOB: 11/06/1980, 37 y.o.   MRN: [id number]  HPI: Jasmine Tran is a 37 y.o. female presenting on 06/23/2017 for comprehensive medical examination. Current medical complaints include:  ADHD FOLLOW UP- stopped taking her vyvanse as it makes her heart pound, stopped it about 2 months ago ADHD status: uncontrolled Satisfied with current therapy: no Medication compliance:  Stopped 2 months ago Controlled substance contract: no Previous psychiatry evaluation: no Previous medications: yes adderall XR and vyvanse (lisdexamfethamine)   Taking meds on weekends/vacations: no Work/school performance:  average Difficulty sustaining attention/completing tasks: yes Distracted by extraneous stimuli: yes Does not listen when spoken to: yes  Fidgets with hands or feet: no Unable to stay in seat: no Blurts out/interrupts others: no ADHD Medication Side Effects: yes- palipitations    Decreased appetite: no    Headache: no    Sleeping disturbance pattern: no    Irritability: no    Rebound effects (worse than baseline) off medication: no    Anxiousness: yes    Dizziness: no    Tics: no  DEPRESSION Mood status: controlled Satisfied with current treatment?: yes Symptom severity: mild  Duration of current treatment : chronic Side effects: yes Medication compliance: excellent compliance Psychotherapy/counseling: no  Previous psychiatric medications:  Depressed mood: no Anxious mood: no Anhedonia: no Significant weight loss or gain: no Insomnia: no  Fatigue: no Feelings of worthlessness or guilt: no Impaired concentration/indecisiveness: no Suicidal ideations: no Hopelessness: no Crying spells: no Depression screen [facility] 2/9 10/28/2016  05/15/2016 05/15/2015  Decreased Interest 0 0 0  Down, Depressed, Hopeless 0 0 0  PHQ - 2 Score 0 0 0    She currently lives with: husband Menopausal Symptoms: no  Depression Screen done today and results listed below:  Depression screen [facility] 2/9 10/28/2016 05/15/2016 05/15/2015  Decreased Interest 0 0 0  Down, Depressed, Hopeless 0 0 0  PHQ - 2 Score 0 0 0    Past Medical History:  Past Medical History:  Diagnosis Date  . ADD (attention deficit disorder)   . Bipolar 1 disorder with moderate mania ([place])   . GERD (gastroesophageal reflux disease)     Surgical History:  Past Surgical History:  Procedure Laterality Date  . APPENDECTOMY    . CHOLECYSTECTOMY    . COLONOSCOPY WITH PROPOFOL N/A 07/18/2016   Procedure: COLONOSCOPY WITH PROPOFOL;  Surgeon: Lucilla Lame, MD;  Location: [place];  Service: Endoscopy;  Laterality: N/A;  . ESOPHAGOGASTRODUODENOSCOPY (EGD) WITH PROPOFOL N/A 07/17/2016   Procedure: ESOPHAGOGASTRODUODENOSCOPY (EGD) WITH PROPOFOL;  Surgeon: Lucilla Lame, MD;  Location: [place];  Service: Endoscopy;  Laterality: N/A;  . OPEN HEPATECTOMY   06/04/2015   Liver adenoma removal  . TUMOR REMOVAL     Liver, benign    Medications:  No current outpatient medications on file prior to visit.   No current facility-administered medications on file prior to visit.     Allergies:  Allergies  Allergen Reactions  . Tape Dermatitis    (tegaderm [place])  . Doxycycline Nausea And Vomiting  . Morphine And Related Hives  . Phenergan [Promethazine Hcl] Nausea And Vomiting  . Sertraline  Other reaction(s): NAUSEA, PUPILS DILATE  . Prednisone Nausea And Vomiting    Social History:  Social History   Socioeconomic History  . Marital status: Married    Spouse name: Not on file  . Number of children: Not on file  . Years of education: Not on file  . Highest education level: Not on file  Social Needs  . Financial resource strain: Not on file  .  Food insecurity - worry: Not on file  . Food insecurity - inability: Not on file  . Transportation needs - medical: Not on file  . Transportation needs - non-medical: Not on file  Occupational History  . Not on file  Tobacco Use  . Smoking status: Former Smoker    Packs/day: 0.25    Years: 5.00    Pack years: 1.25    Types: Cigarettes    Last attempt to quit: 06/04/2015    Years since quitting: 2.0  . Smokeless tobacco: Never Used  Substance and Sexual Activity  . Alcohol use: No  . Drug use: No  . Sexual activity: Yes    Birth control/protection: None  Other Topics Concern  . Not on file  Social History Narrative  . Not on file   Social History   Tobacco Use  Smoking Status Former Smoker  . Packs/day: 0.25  . Years: 5.00  . Pack years: 1.25  . Types: Cigarettes  . Last attempt to quit: 06/04/2015  . Years since quitting: 2.0  Smokeless Tobacco Never Used   Social History   Substance and Sexual Activity  Alcohol Use No    Family History:  Family History  Problem Relation Age of Onset  . Heart disease Mother   . Heart disease Father   . Hypertension Brother   . Cancer Neg Hx   . COPD Neg Hx   . Diabetes Neg Hx   . Stroke Neg Hx     Past medical history, surgical history, medications, allergies, family history and social history reviewed with patient today and changes made to appropriate areas of the chart.   Review of Systems  Constitutional: Negative.   HENT: Negative.   Eyes: Negative.   Respiratory: Negative.   Cardiovascular: Negative.   Gastrointestinal: Negative.   Genitourinary: Negative.   Musculoskeletal: Negative.   Skin: Negative.   Neurological: Negative.   Endo/Heme/Allergies: Negative.   Psychiatric/Behavioral: Negative.     All other ROS negative except what is listed above and in the HPI.      Objective:    BP 108/71 (BP Location: Left Arm, Patient Position: Sitting, Cuff Size: Large)   Pulse 69   Temp 98.3 F (36.8 C)   Ht  5' 3.3" (1.608 m)   Wt 192 lb 6 oz (87.3 kg)   SpO2 99%   BMI 33.76 kg/m   Wt Readings from Last 3 Encounters:  06/23/17 192 lb 6 oz (87.3 kg)  02/19/17 178 lb 8 oz (81 kg)  12/04/16 180 lb (81.6 kg)    Physical Exam  Constitutional: She is oriented to person, place, and time. She appears well-developed and well-nourished. No distress.  HENT:  Head: Normocephalic and atraumatic.  Right Ear: Hearing, tympanic membrane, external ear and ear canal normal.  Left Ear: Hearing, tympanic membrane, external ear and ear canal normal.  Nose: Nose normal.  Mouth/Throat: Uvula is midline, oropharynx is clear and moist and mucous membranes are normal. No oropharyngeal exudate.  Eyes: Conjunctivae, EOM and lids are normal. Pupils are equal, round,  and reactive to light. Right eye exhibits no discharge. Left eye exhibits no discharge. No scleral icterus.  Neck: Normal range of motion. Neck supple. No JVD present. No tracheal deviation present. No thyromegaly present.  Cardiovascular: Normal rate, regular rhythm, normal heart sounds and intact distal pulses. Exam reveals no gallop and no friction rub.  No murmur heard. Pulmonary/Chest: Effort normal and breath sounds normal. No stridor. No respiratory distress. She has no wheezes. She has no rales. She exhibits no tenderness. Right breast exhibits no inverted nipple, no mass, no nipple discharge, no skin change and no tenderness. Left breast exhibits no inverted nipple, no mass, no nipple discharge, no skin change and no tenderness. Breasts are symmetrical.  Abdominal: Soft. Bowel sounds are normal. She exhibits no distension and no mass. There is tenderness in the right upper quadrant. There is no rebound and no guarding. Hernia confirmed negative in the right inguinal area and confirmed negative in the left inguinal area.  Genitourinary: Uterus normal. Rectal exam shows guaiac negative stool. No labial fusion. There is no rash, tenderness, lesion or injury  on the right labia. There is no rash, tenderness, lesion or injury on the left labia. No erythema, tenderness or bleeding in the vagina. No foreign body in the vagina. No signs of injury around the vagina. Vaginal discharge found.  Musculoskeletal: Normal range of motion. She exhibits no edema, tenderness or deformity.  Lymphadenopathy:    She has no cervical adenopathy.       Right: No inguinal adenopathy present.       Left: No inguinal adenopathy present.  Neurological: She is alert and oriented to person, place, and time. She has normal reflexes. She displays normal reflexes. No cranial nerve deficit. She exhibits normal muscle tone. Coordination normal.  Skin: Skin is warm, dry and intact. No rash noted. She is not diaphoretic. No erythema. No pallor.  Psychiatric: She has a normal mood and affect. Her speech is normal and behavior is normal. Judgment and thought content normal. Cognition and memory are normal.  Nursing note and vitals reviewed.   Results for orders placed or performed during the hospital encounter of 12/04/16  Lipase, blood  Result Value Ref Range   Lipase 35 11 - 51 U/L  CBC  Result Value Ref Range   WBC 8.1 3.6 - 11.0 K/uL   RBC 4.21 3.80 - 5.20 MIL/uL   Hemoglobin 13.2 12.0 - 16.0 g/dL   HCT 37.5 35.0 - 47.0 %   MCV 89.0 80.0 - 100.0 fL   MCH 31.3 26.0 - 34.0 pg   MCHC 35.2 32.0 - 36.0 g/dL   RDW 13.4 11.5 - 14.5 %   Platelets 273 150 - 440 K/uL  Urinalysis, Complete w Microscopic  Result Value Ref Range   Color, Urine YELLOW (A) YELLOW   APPearance CLEAR (A) CLEAR   Specific Gravity, Urine 1.028 1.005 - 1.030   pH 5.0 5.0 - 8.0   Glucose, UA NEGATIVE NEGATIVE mg/dL   Hgb urine dipstick SMALL (A) NEGATIVE   Bilirubin Urine NEGATIVE NEGATIVE   Ketones, ur NEGATIVE NEGATIVE mg/dL   Protein, ur NEGATIVE NEGATIVE mg/dL   Nitrite NEGATIVE NEGATIVE   Leukocytes, UA NEGATIVE NEGATIVE   RBC / HPF 0-5 0 - 5 RBC/hpf   WBC, UA 0-5 0 - 5 WBC/hpf   Bacteria, UA  RARE (A) NONE SEEN   Squamous Epithelial / LPF 0-5 (A) NONE SEEN   Mucus PRESENT   Hepatic function panel  Result Value  Ref Range   Total Protein 7.6 6.5 - 8.1 g/dL   Albumin 4.1 3.5 - 5.0 g/dL   AST 20 15 - 41 U/L   ALT 16 14 - 54 U/L   Alkaline Phosphatase 66 38 - 126 U/L   Total Bilirubin 0.5 0.3 - 1.2 mg/dL   Bilirubin, Direct <0.1 (L) 0.1 - 0.5 mg/dL   Indirect Bilirubin NOT CALCULATED 0.3 - 0.9 mg/dL  Basic metabolic panel  Result Value Ref Range   Sodium 140 135 - 145 mmol/L   Potassium 3.7 3.5 - 5.1 mmol/L   Chloride 109 101 - 111 mmol/L   CO2 24 22 - 32 mmol/L   Glucose, Bld 95 65 - 99 mg/dL   BUN 9 6 - 20 mg/dL   Creatinine, Ser 0.79 0.44 - 1.00 mg/dL   Calcium 9.2 8.9 - 10.3 mg/dL   GFR calc non Af Amer >60 >60 mL/min   GFR calc Af Amer >60 >60 mL/min   Anion gap 7 5 - 15  Pregnancy, urine POC  Result Value Ref Range   Preg Test, Ur NEGATIVE NEGATIVE      Assessment & Plan:   Problem List Items Addressed This Visit      Cardiovascular and Mediastinum   Migraine    Stable. Continue current regimen. Continue to monitor. Call with any concerns.       Relevant Medications   oxcarbazepine (TRILEPTAL) 600 MG tablet   lamoTRIgine (LAMICTAL) 200 MG tablet   Other Relevant Orders   CBC with Differential/Platelet   Comprehensive metabolic panel   TSH   Bayer DCA Hb A1c Waived     Digestive   Adenoma of liver    Rechecking labs today. Await results. Call with any concerns.       Relevant Orders   CBC with Differential/Platelet   Comprehensive metabolic panel   TSH   Bayer DCA Hb A1c Waived     Endocrine   PCOS (polycystic ovarian syndrome)    Will treat with 3 months of OCP- Rx sent to her pharmacy.        Other   Bipolar 1 disorder with moderate mania ([facility])    Stable on current regimen. Continue to monitor. Call with any concerns.      Relevant Orders   CBC with Differential/Platelet   Comprehensive metabolic panel   TSH   Bayer DCA Hb A1c  Waived   Lamotrigine level   ADD (attention deficit disorder)    Vyvanse gave her palpitations. Will treat with strattera. Call with any concerns. Recheck 1 month.       Relevant Orders   CBC with Differential/Platelet   Comprehensive metabolic panel   TSH   Bayer DCA Hb A1c Waived   Hematuria    Rechecking urine today. Await results.       Relevant Orders   CBC with Differential/Platelet   Comprehensive metabolic panel   TSH   Bayer DCA Hb A1c Waived   UA/M w/rflx Culture, Routine   Anxiety    Stable on current regimen. Continue to monitor. Call with any concerns.       Relevant Orders   CBC with Differential/Platelet   Comprehensive metabolic panel   TSH   Bayer DCA Hb A1c Waived   Tobacco use   Relevant Orders   CBC with Differential/Platelet   Comprehensive metabolic panel   TSH   Bayer DCA Hb A1c Waived    Other Visit Diagnoses    Routine general medical  examination at a health care facility    -  Primary   Vaccines up to date. Screening labs checked today. Pap done today. Continue diet and exercise. Call with any concerns.    Relevant Orders   CBC with Differential/Platelet   Comprehensive metabolic panel   Lipid Panel w/o Chol/HDL Ratio   TSH   Bayer DCA Hb A1c Waived   UA/M w/rflx Culture, Routine   Nicotine/cotinine metabolites   Physical examination of employee       Labs drawn today.   Relevant Orders   Bayer DCA Hb A1c Waived   Nicotine/cotinine metabolites   Screening for cervical cancer       Pap done today.   Relevant Orders   WET PREP FOR [place], YEAST, CLUE   Vaginal discharge       Negative wet prep, likely physiologic   Relevant Orders   IGP, Aptima HPV, rfx 16/18,45       Follow up plan: Return in about 4 weeks (around 07/21/2017) for Follow up ADD.   LABORATORY TESTING:  - Pap smear: pap done  IMMUNIZATIONS:   - Tdap: Tetanus vaccination status reviewed: last tetanus booster within 10 years. - Influenza: Refused - Pneumovax:  Not applicable  PATIENT COUNSELING:   Advised to take 1 mg of folate supplement per day if capable of pregnancy.   Sexuality: Discussed sexually transmitted diseases, partner selection, use of condoms, avoidance of unintended pregnancy  and contraceptive alternatives.   Advised to avoid cigarette smoking.  I discussed with the patient that most people either abstain from alcohol or drink within safe limits (<=14/week and <=4 drinks/occasion for males, <=7/weeks and <= 3 drinks/occasion for females) and that the risk for alcohol disorders and other health effects rises proportionally with the number of drinks per week and how often a drinker exceeds daily limits.  Discussed cessation/primary prevention of drug use and availability of treatment for abuse.   Diet: Encouraged to adjust caloric intake to maintain  or achieve ideal body weight, to reduce intake of dietary saturated fat and total fat, to limit sodium intake by avoiding high sodium foods and not adding table salt, and to maintain adequate dietary potassium and calcium preferably from fresh fruits, vegetables, and low-fat dairy products.    stressed the importance of regular exercise  Injury prevention: Discussed safety belts, safety helmets, smoke detector, smoking near bedding or upholstery.   Dental health: Discussed importance of regular tooth brushing, flossing, and dental visits.    NEXT PREVENTATIVE PHYSICAL DUE IN 1 YEAR. Return in about 4 weeks (around 07/21/2017) for Follow up ADD.

## 2017-06-23 NOTE — Assessment & Plan Note (Signed)
Vyvanse gave her palpitations. Will treat with strattera. Call with any concerns. Recheck 1 month.

## 2017-06-23 NOTE — Assessment & Plan Note (Signed)
Stable on current regimen. Continue to monitor. Call with any concerns.  

## 2017-06-23 NOTE — Assessment & Plan Note (Signed)
Rechecking urine today. Await results.  

## 2017-06-23 NOTE — Assessment & Plan Note (Signed)
Will treat with 3 months of OCP- Rx sent to her pharmacy.

## 2017-06-23 NOTE — Assessment & Plan Note (Signed)
Rechecking labs today. Await results. Call with any concerns.  

## 2017-06-25 LAB — IGP, APTIMA HPV, RFX 16/18,45
HPV APTIMA: NEGATIVE
PAP SMEAR COMMENT: 0

## 2017-06-26 LAB — COMPREHENSIVE METABOLIC PANEL
ALBUMIN: 4.4 g/dL (ref 3.5–5.5)
ALK PHOS: 73 IU/L (ref 39–117)
ALT: 14 IU/L (ref 0–32)
AST: 14 IU/L (ref 0–40)
Albumin/Globulin Ratio: 1.5 (ref 1.2–2.2)
BUN / CREAT RATIO: 10 (ref 9–23)
BUN: 7 mg/dL (ref 6–20)
CHLORIDE: 104 mmol/L (ref 96–106)
CO2: 20 mmol/L (ref 20–29)
Calcium: 9.4 mg/dL (ref 8.7–10.2)
Creatinine, Ser: 0.73 mg/dL (ref 0.57–1.00)
GFR calc Af Amer: 123 mL/min/{1.73_m2} (ref >59)
GFR calc non Af Amer: 107 mL/min/{1.73_m2} (ref >59)
GLUCOSE: 96 mg/dL (ref 65–99)
Globulin, Total: 3 g/dL (ref 1.5–4.5)
Potassium: 4.3 mmol/L (ref 3.5–5.2)
Sodium: 139 mmol/L (ref 134–144)
Total Protein: 7.4 g/dL (ref 6.0–8.5)

## 2017-06-26 LAB — CBC WITH DIFFERENTIAL/PLATELET
BASOS ABS: 0 10*3/uL (ref 0.0–0.2)
Basos: 0 %
EOS (ABSOLUTE): 0.1 10*3/uL (ref 0.0–0.4)
Eos: 1 %
HEMOGLOBIN: 13.9 g/dL (ref 11.1–15.9)
Hematocrit: 40.9 % (ref 34.0–46.6)
Immature Grans (Abs): 0 10*3/uL (ref 0.0–0.1)
Immature Granulocytes: 0 %
LYMPHS ABS: 2 10*3/uL (ref 0.7–3.1)
LYMPHS: 33 %
MCH: 30.6 pg (ref 26.6–33.0)
MCHC: 34 g/dL (ref 31.5–35.7)
MCV: 90 fL (ref 79–97)
MONOCYTES: 4 %
Monocytes Absolute: 0.2 10*3/uL (ref 0.1–0.9)
Neutrophils Absolute: 3.8 10*3/uL (ref 1.4–7.0)
Neutrophils: 62 %
PLATELETS: 243 10*3/uL (ref 150–379)
RBC: 4.54 x10E6/uL (ref 3.77–5.28)
RDW: 13.1 % (ref 12.3–15.4)
WBC: 6.2 10*3/uL (ref 3.4–10.8)

## 2017-06-26 LAB — TSH: TSH: 2.4 u[IU]/mL (ref 0.450–4.500)

## 2017-06-26 LAB — LIPID PANEL W/O CHOL/HDL RATIO
CHOLESTEROL TOTAL: 185 mg/dL (ref 100–199)
HDL: 62 mg/dL (ref >39)
LDL CALC: 104 mg/dL — AB (ref 0–99)
Triglycerides: 97 mg/dL (ref 0–149)
VLDL CHOLESTEROL CAL: 19 mg/dL (ref 5–40)

## 2017-06-26 LAB — LAMOTRIGINE LEVEL: Lamotrigine Lvl: 2.5 ug/mL (ref 2.0–20.0)

## 2017-06-26 LAB — NICOTINE/COTININE METABOLITES
COTININE: NOT DETECTED ng/mL
Nicotine: NOT DETECTED ng/mL

## 2017-07-21 ENCOUNTER — Ambulatory Visit: Payer: BLUE CROSS/BLUE SHIELD | Admitting: Family Medicine

## 2017-08-01 ENCOUNTER — Encounter: Payer: Self-pay | Admitting: Emergency Medicine

## 2017-08-01 ENCOUNTER — Ambulatory Visit
Admission: EM | Admit: 2017-08-01 | Discharge: 2017-08-01 | Disposition: A | Discharge status: Home / Self Care | Payer: BLUE CROSS/BLUE SHIELD | Attending: Family Medicine | Admitting: Family Medicine

## 2017-08-01 ENCOUNTER — Other Ambulatory Visit: Payer: Self-pay

## 2017-08-01 DIAGNOSIS — R300 Dysuria: Secondary | ICD-10-CM | POA: Diagnosis not present

## 2017-08-01 DIAGNOSIS — R310 Gross hematuria: Secondary | ICD-10-CM | POA: Diagnosis not present

## 2017-08-01 DIAGNOSIS — R109 Unspecified abdominal pain: Secondary | ICD-10-CM | POA: Diagnosis not present

## 2017-08-01 LAB — URINALYSIS, COMPLETE (UACMP) WITH MICROSCOPIC
Bilirubin Urine: NEGATIVE
Glucose, UA: NEGATIVE mg/dL
Ketones, ur: NEGATIVE mg/dL
Leukocytes, UA: NEGATIVE
Nitrite: NEGATIVE
PROTEIN: NEGATIVE mg/dL
Specific Gravity, Urine: 1.015 (ref 1.005–1.030)
pH: 5.5 (ref 5.0–8.0)

## 2017-08-01 NOTE — Discharge Instructions (Signed)
We will call with the culture.  You need to have further work up (either through your primary or through urology).  Call on Monday.  If you worsen, go to the ER.  Take care  Dr. Lacinda Axon

## 2017-08-01 NOTE — ED Triage Notes (Signed)
Patient c/o burning when urinating and blood in her urine for the past 2 days.  Patient reports some suprapubic pain.

## 2017-08-01 NOTE — ED Provider Notes (Signed)
[facility]    CSN: [id number] Arrival date & time: 08/01/17  0940  History   Chief Complaint Chief Complaint  Patient presents with  . Hematuria  . Dysuria   HPI  37 year old female presents with the above complaints.  Patient reports a 48-hour history of hematuria and associated dysuria/itching.  She denies any frequency or urgency.  Mild suprapubic pain.  No fevers or chills.  No back pain or flank pain.  No known inciting factor.  No known exacerbating relieving factors.  Patient reports that she has a history of hematuria.  Other complaints or concerns at this time.  Past Medical History:  Diagnosis Date  . ADD (attention deficit disorder)   . Bipolar 1 disorder with moderate mania ([place])   . GERD (gastroesophageal reflux disease)    Patient Active Problem List   Diagnosis Date Noted  . Heartburn   . Anxiety 06/16/2015  . PCOS (polycystic ovarian syndrome) 04/19/2015  . Adenoma of liver 04/16/2015  . Hematuria 03/30/2015  . Bipolar 1 disorder with moderate mania ([place])   . ADD (attention deficit disorder)   . Carpal tunnel syndrome, right 07/21/2013  . Allergic rhinitis 03/10/2013  . Migraine 03/10/2013  . Obesity (BMI 30-39.9) 03/10/2013  . Tobacco use 03/10/2013    Past Surgical History:  Procedure Laterality Date  . APPENDECTOMY    . CHOLECYSTECTOMY    . COLONOSCOPY WITH PROPOFOL N/A 07/18/2016   Procedure: COLONOSCOPY WITH PROPOFOL;  Surgeon: Lucilla Lame, MD;  Location: [place];  Service: Endoscopy;  Laterality: N/A;  . ESOPHAGOGASTRODUODENOSCOPY (EGD) WITH PROPOFOL N/A 07/17/2016   Procedure: ESOPHAGOGASTRODUODENOSCOPY (EGD) WITH PROPOFOL;  Surgeon: Lucilla Lame, MD;  Location: [place];  Service: Endoscopy;  Laterality: N/A;  . OPEN HEPATECTOMY   06/04/2015   Liver adenoma removal  . TUMOR REMOVAL     Liver, benign    OB History    Gravida Para Term Preterm AB Living   0 0 0 0 0 0   SAB TAB Ectopic Multiple Live Births     0 0 0 0 0       Home Medications    Prior to Admission medications   Medication Sig Start Date End Date Taking? Authorizing Provider  L-Methylfolate-Algae (DEPLIN 15) 15-90.314 MG CAPS Take 1 tablet by mouth daily. 06/23/17  Yes Johnson, Megan P, DO  lamoTRIgine (LAMICTAL) 200 MG tablet Take 1 tablet (200 mg total) by mouth daily. 06/23/17  Yes Johnson, Megan P, DO  norethindrone-ethinyl estradiol (JUNEL 1/20) 1-20 MG-MCG tablet Take 1 tablet by mouth daily. To be taken continuously for 3 months and then skip a week 06/23/17  Yes Johnson, Megan P, DO  oxcarbazepine (TRILEPTAL) 600 MG tablet Take 1 tablet (600 mg total) by mouth 2 (two) times daily. 06/23/17  Yes Valerie Roys, DO    Family History Family History  Problem Relation Age of Onset  . Heart disease Mother   . Heart disease Father   . Hypertension Brother   . Cancer Neg Hx   . COPD Neg Hx   . Diabetes Neg Hx   . Stroke Neg Hx     Social History Social History   Tobacco Use  . Smoking status: Former Smoker    Packs/day: 0.25    Years: 5.00    Pack years: 1.25    Types: Cigarettes    Last attempt to quit: 06/04/2015    Years since quitting: 2.1  . Smokeless tobacco: Never Used  Substance  Use Topics  . Alcohol use: No  . Drug use: No     Allergies   Tape; Doxycycline; Morphine and related; Phenergan [promethazine hcl]; Sertraline; and Prednisone   Review of Systems Review of Systems  Constitutional: Negative.   Gastrointestinal: Positive for abdominal pain.  Genitourinary: Positive for dysuria and hematuria.   Physical Exam Triage Vital Signs ED Triage Vitals  Enc Vitals Group     BP 08/01/17 0958 121/75     Pulse Rate 08/01/17 0958 81     Resp 08/01/17 0958 16     Temp 08/01/17 0958 98.3 F (36.8 C)     Temp Source 08/01/17 0958 Oral     SpO2 08/01/17 0958 99 %     Weight 08/01/17 0954 185 lb (83.9 kg)     Height 08/01/17 0954 5\' 3"  (1.6 m)     Head Circumference --      Peak Flow --       Pain Score 08/01/17 0954 3     Pain Loc --      Pain Edu? --      Excl. in [place]? --    Updated Vital Signs BP 121/75 (BP Location: Left Arm)   Pulse 81   Temp 98.3 F (36.8 C) (Oral)   Resp 16   Ht 5\' 3"  (1.6 m)   Wt 185 lb (83.9 kg)   LMP 07/04/2017 (Approximate)   SpO2 99%   BMI 32.77 kg/m   Physical Exam  Constitutional: She is oriented to person, place, and time. She appears well-developed. No distress.  HENT:  Head: Normocephalic and atraumatic.  Cardiovascular: Normal rate and regular rhythm.  Pulmonary/Chest: Effort normal and breath sounds normal.  Abdominal: Soft.  Mild suprapubic tenderness.  Neurological: She is alert and oriented to person, place, and time.  Psychiatric: She has a normal mood and affect. Her behavior is normal.  Nursing note and vitals reviewed.  UC Treatments / Results  Labs (all labs ordered are listed, but only abnormal results are displayed) Labs Reviewed  URINALYSIS, COMPLETE (UACMP) WITH MICROSCOPIC - Abnormal; Notable for the following components:      Result Value   Hgb urine dipstick LARGE (*)    Squamous Epithelial / LPF 0-5 (*)    Bacteria, UA FEW (*)    All other components within normal limits  URINE CULTURE    EKG  EKG Interpretation None       Radiology No results found.  Procedures Procedures (including critical care time)  Medications Ordered in UC Medications - No data to display   Initial Impression / Assessment and Plan / UC Course  I have reviewed the triage vital signs and the nursing notes.  Pertinent labs & imaging results that were available during my care of the patient were reviewed by me and considered in my medical decision making (see chart for details).     37 year old female presents with dysuria and gross hematuria.  Her urinalysis revealed hematuria but no pyuria.  I doubt that she has a urinary tract infection given absence of white blood cells.  Sending culture.  Advised that the etiology  of her symptomatology is unclear at this time.  She needs further workup for gross hematuria.  Advised follow-up with primary care physician or urology.  Final Clinical Impressions(s) / UC Diagnoses   Final diagnoses:  Gross hematuria    ED Discharge Orders    None     Controlled Substance Prescriptions [place] Controlled Substance Registry consulted?  Not Applicable   Coral Spikes, DO 08/01/17 1046

## 2017-08-03 ENCOUNTER — Encounter: Payer: Self-pay | Admitting: Family Medicine

## 2017-08-03 LAB — URINE CULTURE: CULTURE: NO GROWTH

## 2017-08-04 ENCOUNTER — Ambulatory Visit (INDEPENDENT_AMBULATORY_CARE_PROVIDER_SITE_OTHER): Payer: BLUE CROSS/BLUE SHIELD | Admitting: Family Medicine

## 2017-08-04 ENCOUNTER — Encounter: Payer: Self-pay | Admitting: Family Medicine

## 2017-08-04 ENCOUNTER — Telehealth: Payer: Self-pay | Admitting: Family Medicine

## 2017-08-04 ENCOUNTER — Other Ambulatory Visit: Payer: Self-pay | Admitting: Family Medicine

## 2017-08-04 VITALS — BP 118/77 | HR 75 | Temp 98.5°F | Wt 190.5 lb

## 2017-08-04 DIAGNOSIS — N926 Irregular menstruation, unspecified: Secondary | ICD-10-CM

## 2017-08-04 DIAGNOSIS — N921 Excessive and frequent menstruation with irregular cycle: Secondary | ICD-10-CM

## 2017-08-04 MED ORDER — NORETHINDRONE ACET-ETHINYL EST 1.5-30 MG-MCG PO TABS: 1.0000 | ORAL_TABLET | Freq: Every day | ORAL | 11 refills | Status: DC

## 2017-08-04 NOTE — Telephone Encounter (Signed)
Copied from [place]. Topic: Quick Communication - See Telephone Encounter >> Aug 04, 2017  2:01 PM Oneta Rack wrote: Relation to pt: self Call back [phone] Pharmacy: [place], [address] [phone] ([phone] (Fax)    Reason for call:   Patient was seen today and was inquiring if PCP was going to prescribed Rx discussed at the appointment, patient doesn't know what the Rx PCP was going to send, please advise.  >> Aug 04, 2017  2:28 PM Oneta Rack wrote: Relation to pt: self Call back [phone] Pharmacy: [place], [place] [phone] (Phone) [phone] (Fax)     Reason for call:   Patient was seen today and was inquiring if PCP was going to prescribed Rx discussed at the appointment, patient doesn't know what the Rx PCP was going to send, please advise.

## 2017-08-04 NOTE — Telephone Encounter (Signed)
Responded to patient by [employer]

## 2017-08-04 NOTE — Progress Notes (Signed)
BP 118/77 (BP Location: Left Arm, Patient Position: Sitting, Cuff Size: Normal)   Pulse 75   Temp 98.5 F (36.9 C) (Oral)   Wt 190 lb 8 oz (86.4 kg)   SpO2 99%   BMI 33.75 kg/m    Subjective:    Patient ID: Jasmine Tran, female    DOB: [birth year], 37 y.o.   MRN: [id number]  HPI: Rabia Argote is a 37 y.o. female  Chief Complaint  Patient presents with  . Hematuria    follow-up from urgent care visit   ABNORMAL MENSTRUAL PERIODS- had a period 2.5 weeks ago, having another one now for 5 day- this is longer than a usual period, nothing abnormal with a period [id number] Duration: 5 days Average interval between menses: 28-31 days Length of menses: 3-4 days Flow: heavy Dysmenorrhea: yes Intermenstrual bleeding:yes Postcoital bleeding: no Contraception: OCP Sexual activity: In a Monogamous Relationship History of sexually transmitted diseases: no History GYN procedures: no Abnormal pap smears: no   Dyspareunia: no Vaginal discharge:no Abdominal pain: yes Galactorrhea: no Hirsuitism: no Frequent bruising/mucosal bleeding: no Double vision:no Hot flashes: no  Relevant past medical, surgical, family and social history reviewed and updated as indicated. Interim medical history since our last visit reviewed. Allergies and medications reviewed and updated.  Review of Systems  Constitutional: Negative.   Respiratory: Negative.   Cardiovascular: Negative.   Genitourinary: Positive for menstrual problem, pelvic pain and vaginal bleeding. Negative for decreased urine volume, difficulty urinating, dyspareunia, dysuria, enuresis, flank pain, frequency, genital sores, hematuria, urgency, vaginal discharge and vaginal pain.  Musculoskeletal: Negative.   Psychiatric/Behavioral: Negative.     Per HPI unless specifically indicated above     Objective:    BP 118/77 (BP Location: Left Arm, Patient Position: Sitting, Cuff Size: Normal)   Pulse 75   Temp 98.5 F (36.9 C) (Oral)    Wt 190 lb 8 oz (86.4 kg)   SpO2 99%   BMI 33.75 kg/m   Wt Readings from Last 3 Encounters:  08/04/17 190 lb 8 oz (86.4 kg)  08/01/17 185 lb (83.9 kg)  06/23/17 192 lb 6 oz (87.3 kg)    Physical Exam  Constitutional: She is oriented to person, place, and time. She appears well-developed and well-nourished. No distress.  HENT:  Head: Normocephalic and atraumatic.  Right Ear: Hearing normal.  Left Ear: Hearing normal.  Nose: Nose normal.  Eyes: Conjunctivae and lids are normal. Right eye exhibits no discharge. Left eye exhibits no discharge. No scleral icterus.  Cardiovascular: Normal rate, regular rhythm, normal heart sounds and intact distal pulses. Exam reveals no gallop and no friction rub.  No murmur heard. Pulmonary/Chest: Effort normal and breath sounds normal. No respiratory distress. She has no wheezes. She has no rales. She exhibits no tenderness.  Musculoskeletal: Normal range of motion.  Neurological: She is alert and oriented to person, place, and time.  Skin: Skin is warm, dry and intact. No rash noted. She is not diaphoretic. No erythema. No pallor.  Psychiatric: She has a normal mood and affect. Her speech is normal and behavior is normal. Judgment and thought content normal. Cognition and memory are normal.    Results for orders placed or performed during the hospital encounter of 08/01/17  Urine Culture  Result Value Ref Range   Specimen Description      URINE, RANDOM Performed at [facility], [address], [place], [place] [address]    Special Requests      NONE Performed at  [facility] [place], [address], [place], [place] [address]    Culture      NO GROWTH Performed at [facility], [place] [address], [place], [place] [address]    Report Status 08/03/2017 FINAL   Urinalysis, Complete w Microscopic  Result Value Ref Range   Color, Urine YELLOW YELLOW   APPearance CLEAR CLEAR   Specific Gravity, Urine 1.015 1.005 - 1.030    pH 5.5 5.0 - 8.0   Glucose, UA NEGATIVE NEGATIVE mg/dL   Hgb urine dipstick LARGE (A) NEGATIVE   Bilirubin Urine NEGATIVE NEGATIVE   Ketones, ur NEGATIVE NEGATIVE mg/dL   Protein, ur NEGATIVE NEGATIVE mg/dL   Nitrite NEGATIVE NEGATIVE   Leukocytes, UA NEGATIVE NEGATIVE   Squamous Epithelial / LPF 0-5 (A) NONE SEEN   WBC, UA 0-5 0 - 5 WBC/hpf   RBC / HPF 6-30 0 - 5 RBC/hpf   Bacteria, UA FEW (A) NONE SEEN      Assessment & Plan:   Problem List Items Addressed This Visit    None    Visit Diagnoses    Abnormal menstrual periods    -  Primary   Checking pregnancy today and [place] on Friday. Call if getting worse. Await results.    Relevant Orders   CBC with Differential/Platelet   hCG, serum, qualitative   US Transvaginal Non-OB   US Pelvis Complete   Menorrhagia with irregular cycle       Checking CBC today. Await results.    Relevant Orders   CBC with Differential/Platelet   US Pelvis Complete       Follow up plan: Return Pending results. Marland Kitchen

## 2017-08-05 LAB — CBC WITH DIFFERENTIAL/PLATELET

## 2017-08-05 LAB — HCG, SERUM, QUALITATIVE

## 2017-08-07 ENCOUNTER — Ambulatory Visit: Payer: BLUE CROSS/BLUE SHIELD

## 2017-08-11 ENCOUNTER — Ambulatory Visit: Admission: RE | Admit: 2017-08-11 | Payer: BLUE CROSS/BLUE SHIELD | Source: Ambulatory Visit

## 2017-09-24 ENCOUNTER — Encounter: Payer: Self-pay | Admitting: Family Medicine

## 2017-10-11 ENCOUNTER — Ambulatory Visit (INDEPENDENT_AMBULATORY_CARE_PROVIDER_SITE_OTHER): Payer: BLUE CROSS/BLUE SHIELD

## 2017-10-11 ENCOUNTER — Encounter: Payer: Self-pay | Admitting: Gynecology

## 2017-10-11 ENCOUNTER — Ambulatory Visit
Admission: EM | Admit: 2017-10-11 | Discharge: 2017-10-11 | Disposition: A | Discharge status: Home / Self Care | Payer: BLUE CROSS/BLUE SHIELD | Attending: Family Medicine | Admitting: Family Medicine

## 2017-10-11 ENCOUNTER — Other Ambulatory Visit: Payer: Self-pay

## 2017-10-11 DIAGNOSIS — S93602A Unspecified sprain of left foot, initial encounter: Secondary | ICD-10-CM | POA: Diagnosis not present

## 2017-10-11 DIAGNOSIS — M25572 Pain in left ankle and joints of left foot: Secondary | ICD-10-CM

## 2017-10-11 DIAGNOSIS — S93402A Sprain of unspecified ligament of left ankle, initial encounter: Secondary | ICD-10-CM

## 2017-10-11 DIAGNOSIS — S99912A Unspecified injury of left ankle, initial encounter: Secondary | ICD-10-CM | POA: Diagnosis not present

## 2017-10-11 DIAGNOSIS — W19XXXA Unspecified fall, initial encounter: Secondary | ICD-10-CM

## 2017-10-11 DIAGNOSIS — S99922A Unspecified injury of left foot, initial encounter: Secondary | ICD-10-CM | POA: Diagnosis not present

## 2017-10-11 DIAGNOSIS — M79672 Pain in left foot: Secondary | ICD-10-CM | POA: Diagnosis not present

## 2017-10-11 DIAGNOSIS — M7989 Other specified soft tissue disorders: Secondary | ICD-10-CM | POA: Diagnosis not present

## 2017-10-11 NOTE — Discharge Instructions (Signed)
Ice. Elevate. Rest. Drink plenty of fluids.  Gradual increase of activity as tolerated.  Follow-up with orthopedic as needed for continued pain.  Follow up with your primary care physician this week as needed. Return to Urgent care for new or worsening concerns.

## 2017-10-11 NOTE — ED Provider Notes (Signed)
[facility] ____________________________________________  Time seen: Approximately 1:00 PM  I have reviewed the triage vital signs and the nursing notes.   HISTORY  Chief Complaint Ankle Injury   HPI Jasmine Tran is a 37 y.o. female presenting for evaluation of left ankle and left foot pain post injury that occurred yesterday.  Patient states that she was rockclimbing indoors, and a stepping rock twisted causing her to fall.  States when she fell she fell with her ankle between the 2 pads and her ankle took all of her weight.  Reports pain since then.  Denies any other injuries.  Reports otherwise feels well.  States difficulty with weightbearing as well as movement.  No alleviating measures attempted prior to arrival.  Has used crutches to come in today due to pain with weightbearing.  Reports otherwise feels well. Denies recent sickness. Denies recent antibiotic use.   Patient's last menstrual period was 10/04/2017.Denies pregnancy.   Past Medical History:  Diagnosis Date  . ADD (attention deficit disorder)   . Bipolar 1 disorder with moderate mania ([place])   . GERD (gastroesophageal reflux disease)     Patient Active Problem List   Diagnosis Date Noted  . Heartburn   . Anxiety 06/16/2015  . PCOS (polycystic ovarian syndrome) 04/19/2015  . Adenoma of liver 04/16/2015  . Hematuria 03/30/2015  . Bipolar 1 disorder with moderate mania ([place])   . ADD (attention deficit disorder)   . Carpal tunnel syndrome, right 07/21/2013  . Allergic rhinitis 03/10/2013  . Migraine 03/10/2013  . Obesity (BMI 30-39.9) 03/10/2013  . Tobacco use 03/10/2013    Past Surgical History:  Procedure Laterality Date  . APPENDECTOMY    . CHOLECYSTECTOMY    . COLONOSCOPY WITH PROPOFOL N/A 07/18/2016   Procedure: COLONOSCOPY WITH PROPOFOL;  Surgeon: Lucilla Lame, MD;  Location: [place];  Service: Endoscopy;  Laterality: N/A;  . ESOPHAGOGASTRODUODENOSCOPY (EGD) WITH PROPOFOL N/A  07/17/2016   Procedure: ESOPHAGOGASTRODUODENOSCOPY (EGD) WITH PROPOFOL;  Surgeon: Lucilla Lame, MD;  Location: [place];  Service: Endoscopy;  Laterality: N/A;  . OPEN HEPATECTOMY   06/04/2015   Liver adenoma removal  . TUMOR REMOVAL     Liver, benign     No current facility-administered medications for this encounter.   Current Outpatient Medications:  .  L-Methylfolate-Algae (DEPLIN 15) 15-90.314 MG CAPS, Take 1 tablet by mouth daily., Disp: 90 capsule, Rfl: 3 .  lamoTRIgine (LAMICTAL) 200 MG tablet, Take 1 tablet (200 mg total) by mouth daily., Disp: 90 tablet, Rfl: 1 .  Norethindrone Acetate-Ethinyl Estradiol (JUNEL 1.5/30) 1.5-30 MG-MCG tablet, Take 1 tablet by mouth daily., Disp: 1 Package, Rfl: 11 .  oxcarbazepine (TRILEPTAL) 600 MG tablet, Take 1 tablet (600 mg total) by mouth 2 (two) times daily., Disp: 180 tablet, Rfl: 1  Allergies Tape; Doxycycline; Morphine and related; Phenergan [promethazine hcl]; Sertraline; and Prednisone  Family History  Problem Relation Age of Onset  . Heart disease Mother   . Heart disease Father   . Hypertension Brother   . Cancer Neg Hx   . COPD Neg Hx   . Diabetes Neg Hx   . Stroke Neg Hx     Social History Social History   Tobacco Use  . Smoking status: Former Smoker    Packs/day: 0.25    Years: 5.00    Pack years: 1.25    Types: Cigarettes    Last attempt to quit: 06/04/2015    Years since quitting: 2.3  . Smokeless tobacco: Never Used  Substance Use Topics  . Alcohol use: No  . Drug use: No    Review of Systems Constitutional: No fever Cardiovascular: Denies chest pain. Respiratory: Denies shortness of breath. Gastrointestinal: No abdominal pain.   Musculoskeletal: Negative for back pain.as above.   ____________________________________________   PHYSICAL EXAM:  VITAL SIGNS: ED Triage Vitals  Enc Vitals Group     BP 10/11/17 1148 132/84     Pulse Rate 10/11/17 1148 79     Resp 10/11/17 1148 16     Temp  10/11/17 1148 98.5 F (36.9 C)     Temp Source 10/11/17 1148 Oral     SpO2 10/11/17 1148 100 %     Weight 10/11/17 1148 170 lb (77.1 kg)     Height 10/11/17 1148 5\' 3"  (1.6 m)     Head Circumference --      Peak Flow --      Pain Score 10/11/17 1149 7     Pain Loc --      Pain Edu? --      Excl. in [place]? --     Constitutional: Alert and oriented. Well appearing and in no acute distress. ENT      Head: Normocephalic and atraumatic. Cardiovascular:  Good peripheral circulation. Respiratory: Normal respiratory effort without tachypnea nor retractions.  Musculoskeletal:  Bilateral pedal pulses equal and easily palpated.  Gait not tested due to pain. Except: Left lateral ankle mild to moderate edema present with moderate tenderness to direct palpation, mild medial ankle edema and tenderness present, dorsal midfoot to dorsal distal third through fifth metatarsal mild tenderness to direct palpation, no pain with plantarflexion, pain with dorsiflexion and limited dorsiflexion and ankle rotation, normal distal sensation and capillary refill, minimal distal left fibular tenderness, left lower extremity otherwise nontender. Neurologic:  Normal speech and language.  Speech is normal.  Skin:  Skin is warm, dry and intact. No rash noted. Psychiatric: Mood and affect are normal. Speech and behavior are normal. Patient exhibits appropriate insight and judgment   ___________________________________________   LABS (all labs ordered are listed, but only abnormal results are displayed)  Labs Reviewed - No data to display  RADIOLOGY  Dg Ankle Complete Left  Result Date: 10/11/2017 CLINICAL DATA:  Pain and swelling left ankle after falling from rock climbing wall. EXAM: LEFT ANKLE COMPLETE - 3+ VIEW COMPARISON:  None. FINDINGS: No fracture or bone lesion. Ankle mortise normally spaced and aligned.  No arthropathic changes. There is soft tissue swelling which predominates laterally. IMPRESSION: 1. No  fracture, bone lesion or ankle joint abnormality. 2. Soft tissue swelling. Electronically Signed   By: Lajean Manes M.D.   On: 10/11/2017 12:08   Dg Foot Complete Left  Result Date: 10/11/2017 CLINICAL DATA:  Left foot pain after fall. EXAM: LEFT FOOT - COMPLETE 3+ VIEW COMPARISON:  None. FINDINGS: There is no evidence of fracture or dislocation. There is no evidence of arthropathy or other focal bone abnormality. Soft tissues are unremarkable. IMPRESSION: Normal left foot. Electronically Signed   By: Marijo Conception, M.D.   On: 10/11/2017 12:44   ____________________________________________   PROCEDURES Procedures   Crutches and Ace bandage applied. INITIAL IMPRESSION / ASSESSMENT AND PLAN / ED COURSE  Pertinent labs & imaging results that were available during my care of the patient were reviewed by me and considered in my medical decision making (see chart for details).  Well-appearing patient.  No acute distress.  Left foot and left ankle pain post mechanical injury that  occurred yesterday.  X-ray results as above per radiologist and reviewed by myself.  Negative for fracture.  Suspect sprain injury.  Splint applied and crutches given.  Encourage gradual increase of activity as tolerated over the next 3 days.  Over-the-counter ibuprofen, Tylenol and supportive care including ice and elevation.  Follow-up with orthopedic as needed for continued pain.  Discussed follow up with Primary care physician this week. Discussed follow up and return parameters including no resolution or any worsening concerns. Patient verbalized understanding and agreed to plan.   ____________________________________________   FINAL CLINICAL IMPRESSION(S) / ED DIAGNOSES  Final diagnoses:  Sprain of left ankle, unspecified ligament, initial encounter  Foot sprain, left, initial encounter     ED Discharge Orders    None       Note: This dictation was prepared with Dragon dictation along with smaller  phrase technology. Any transcriptional errors that result from this process are unintentional.         Marylene Land, NP 10/11/17 1328

## 2017-10-11 NOTE — ED Triage Notes (Signed)
Per patient left ankle injury while rock climbing x yesterday.

## 2017-11-03 ENCOUNTER — Ambulatory Visit (INDEPENDENT_AMBULATORY_CARE_PROVIDER_SITE_OTHER): Payer: BLUE CROSS/BLUE SHIELD | Admitting: Family Medicine

## 2017-11-03 ENCOUNTER — Encounter: Payer: Self-pay | Admitting: Family Medicine

## 2017-11-03 VITALS — BP 127/84 | HR 63 | Temp 98.4°F | Wt 189.2 lb

## 2017-11-03 DIAGNOSIS — D134 Benign neoplasm of liver: Secondary | ICD-10-CM | POA: Diagnosis not present

## 2017-11-03 NOTE — Progress Notes (Signed)
BP 127/84 (BP Location: Left Arm, Patient Position: Sitting, Cuff Size: Normal)   Pulse 63   Temp 98.4 F (36.9 C)   Wt 189 lb 4 oz (85.8 kg)   LMP 10/04/2017   SpO2 99%   BMI 33.52 kg/m    Subjective:    Patient ID: Jasmine Tran, female    DOB: [birth year], 37 y.o.   MRN: [id number]  HPI: Jasmine Tran is a 37 y.o. female  Chief Complaint  Patient presents with  . Pain    Patient states that she is having the same fullness in her side that she did when she had issues with her liver in the past, this has been going on for a week   Has been having some fullness for 1 week, pain for 3-4 days, radiating into her back. Feels just like when she had her adenoma before. Pain is sharp and pulling. Radiates from her RUQ to her back. Nothing particularly makes it better or worse. No nausea. No vomiting. She is very anxious that this is another adenoma, as she had to have one removed in the past. No other concerns or complaints at this time.   Relevant past medical, surgical, family and social history reviewed and updated as indicated. Interim medical history since our last visit reviewed. Allergies and medications reviewed and updated.  Review of Systems  Constitutional: Negative.   Respiratory: Negative.   Cardiovascular: Negative.   Gastrointestinal: Positive for abdominal distention and abdominal pain. Negative for anal bleeding, blood in stool, constipation, diarrhea, nausea, rectal pain and vomiting.  Skin: Negative.   Psychiatric/Behavioral: Negative.     Per HPI unless specifically indicated above     Objective:    BP 127/84 (BP Location: Left Arm, Patient Position: Sitting, Cuff Size: Normal)   Pulse 63   Temp 98.4 F (36.9 C)   Wt 189 lb 4 oz (85.8 kg)   LMP 10/04/2017   SpO2 99%   BMI 33.52 kg/m   Wt Readings from Last 3 Encounters:  11/03/17 189 lb 4 oz (85.8 kg)  10/11/17 170 lb (77.1 kg)  08/04/17 190 lb 8 oz (86.4 kg)    Physical Exam  Constitutional: She  is oriented to person, place, and time. She appears well-developed and well-nourished. No distress.  HENT:  Head: Normocephalic and atraumatic.  Right Ear: Hearing normal.  Left Ear: Hearing normal.  Nose: Nose normal.  Eyes: Conjunctivae and lids are normal. Right eye exhibits no discharge. Left eye exhibits no discharge. No scleral icterus.  Cardiovascular: Normal rate, regular rhythm, normal heart sounds and intact distal pulses. Exam reveals no gallop and no friction rub.  No murmur heard. Pulmonary/Chest: Effort normal and breath sounds normal. No stridor. No respiratory distress. She has no wheezes. She has no rales. She exhibits no tenderness.  Abdominal: Soft. Bowel sounds are normal. She exhibits no distension and no mass. There is tenderness (RUQ). There is no rebound and no guarding. No hernia.  Musculoskeletal: Normal range of motion.  Neurological: She is alert and oriented to person, place, and time.  Skin: Skin is warm, dry and intact. Capillary refill takes less than 2 seconds. No rash noted. She is not diaphoretic. No erythema. No pallor.  Psychiatric: She has a normal mood and affect. Her speech is normal and behavior is normal. Judgment and thought content normal. Cognition and memory are normal.    Results for orders placed or performed in visit on 08/04/17  CBC with Differential/Platelet  Result Value  Ref Range   WBC CANCELED x10E3/uL  hCG, serum, qualitative  Result Value Ref Range   hCG,Beta Subunit,Qual,Serum CANCELED mIU/mL      Assessment & Plan:   Problem List Items Addressed This Visit      Digestive   Adenoma of liver - Primary    Unclear if it's back. Will obtain CMP and [place] of liver. If it's back- will not be able to take OCPs. Await results.       Relevant Orders   Comprehensive metabolic panel   US Abdomen Limited RUQ       Follow up plan: Return Pending results. Marland Kitchen

## 2017-11-03 NOTE — Patient Instructions (Signed)
[facility] [address] [place], [place] [address]  [phone]  Tomorrow, 6/19 8:30AM Nothing to eat or drink after Midnight tonight

## 2017-11-03 NOTE — Assessment & Plan Note (Signed)
Unclear if it's back. Will obtain CMP and [place] of liver. If it's back- will not be able to take OCPs. Await results.

## 2017-11-04 ENCOUNTER — Telehealth: Payer: Self-pay | Admitting: Family Medicine

## 2017-11-04 ENCOUNTER — Ambulatory Visit
Admission: RE | Admit: 2017-11-04 | Discharge: 2017-11-04 | Disposition: A | Discharge status: Home / Self Care | Payer: BLUE CROSS/BLUE SHIELD | Source: Ambulatory Visit | Attending: Family Medicine | Admitting: Family Medicine

## 2017-11-04 DIAGNOSIS — C229 Malignant neoplasm of liver, not specified as primary or secondary: Secondary | ICD-10-CM | POA: Diagnosis not present

## 2017-11-04 DIAGNOSIS — D134 Benign neoplasm of liver: Secondary | ICD-10-CM

## 2017-11-04 DIAGNOSIS — Z9049 Acquired absence of other specified parts of digestive tract: Secondary | ICD-10-CM | POA: Diagnosis not present

## 2017-11-04 LAB — COMPREHENSIVE METABOLIC PANEL
ALBUMIN: 4.5 g/dL (ref 3.5–5.5)
ALK PHOS: 61 IU/L (ref 39–117)
ALT: 15 IU/L (ref 0–32)
AST: 13 IU/L (ref 0–40)
Albumin/Globulin Ratio: 1.5 (ref 1.2–2.2)
BUN / CREAT RATIO: 14 (ref 9–23)
BUN: 10 mg/dL (ref 6–20)
Bilirubin Total: 0.3 mg/dL (ref 0.0–1.2)
CALCIUM: 9.5 mg/dL (ref 8.7–10.2)
CO2: 19 mmol/L — AB (ref 20–29)
CREATININE: 0.69 mg/dL (ref 0.57–1.00)
Chloride: 105 mmol/L (ref 96–106)
GFR calc Af Amer: 130 mL/min/{1.73_m2} (ref >59)
GFR, EST NON AFRICAN AMERICAN: 113 mL/min/{1.73_m2} (ref >59)
GLOBULIN, TOTAL: 3 g/dL (ref 1.5–4.5)
GLUCOSE: 90 mg/dL (ref 65–99)
Potassium: 4.4 mmol/L (ref 3.5–5.2)
Sodium: 139 mmol/L (ref 134–144)
Total Protein: 7.5 g/dL (ref 6.0–8.5)

## 2017-11-04 MED ORDER — CYCLOBENZAPRINE HCL 10 MG PO TABS: 10.0000 mg | ORAL_TABLET | Freq: Three times a day (TID) | ORAL | 1 refills | Status: DC | PRN

## 2017-11-04 NOTE — Telephone Encounter (Signed)
Please let Jasmine Tran know that her liver functions are entirely normal. I'll let her know as soon as I get the [place] report back. (She's very anxious- so you can call her before 9)

## 2017-11-04 NOTE — Telephone Encounter (Signed)
Patient notified

## 2017-11-04 NOTE — Telephone Encounter (Signed)
Called and discussed results with patient. [place] normal. Labs normal. Will start muscle relaxor in case it's musculoskeletal and recheck in a couple of weeks- if not better, will check MRI.

## 2017-11-09 ENCOUNTER — Encounter: Payer: Self-pay | Admitting: Family Medicine

## 2017-12-12 ENCOUNTER — Ambulatory Visit
Admission: EM | Admit: 2017-12-12 | Discharge: 2017-12-12 | Discharge status: Against Medical Advice | Payer: BLUE CROSS/BLUE SHIELD

## 2017-12-12 DIAGNOSIS — M7989 Other specified soft tissue disorders: Secondary | ICD-10-CM | POA: Diagnosis not present

## 2017-12-12 DIAGNOSIS — L539 Erythematous condition, unspecified: Secondary | ICD-10-CM | POA: Diagnosis not present

## 2017-12-12 DIAGNOSIS — F17200 Nicotine dependence, unspecified, uncomplicated: Secondary | ICD-10-CM | POA: Diagnosis not present

## 2017-12-12 DIAGNOSIS — Z885 Allergy status to narcotic agent status: Secondary | ICD-10-CM | POA: Diagnosis not present

## 2017-12-12 DIAGNOSIS — W5911XA Bitten by nonvenomous snake, initial encounter: Secondary | ICD-10-CM | POA: Diagnosis not present

## 2017-12-12 DIAGNOSIS — T7840XA Allergy, unspecified, initial encounter: Secondary | ICD-10-CM | POA: Diagnosis not present

## 2017-12-12 DIAGNOSIS — T63004A Toxic effect of unspecified snake venom, undetermined, initial encounter: Secondary | ICD-10-CM | POA: Diagnosis not present

## 2017-12-12 DIAGNOSIS — F319 Bipolar disorder, unspecified: Secondary | ICD-10-CM | POA: Diagnosis not present

## 2017-12-12 DIAGNOSIS — T63001A Toxic effect of unspecified snake venom, accidental (unintentional), initial encounter: Secondary | ICD-10-CM | POA: Diagnosis not present

## 2017-12-12 DIAGNOSIS — Z9049 Acquired absence of other specified parts of digestive tract: Secondary | ICD-10-CM | POA: Diagnosis not present

## 2017-12-21 ENCOUNTER — Ambulatory Visit (INDEPENDENT_AMBULATORY_CARE_PROVIDER_SITE_OTHER): Payer: BLUE CROSS/BLUE SHIELD | Admitting: Family Medicine

## 2017-12-21 ENCOUNTER — Other Ambulatory Visit: Payer: Self-pay

## 2017-12-21 ENCOUNTER — Encounter: Payer: Self-pay | Admitting: Family Medicine

## 2017-12-21 VITALS — BP 123/80 | HR 78 | Temp 98.3°F | Ht 63.0 in | Wt 192.8 lb

## 2017-12-21 DIAGNOSIS — J309 Allergic rhinitis, unspecified: Secondary | ICD-10-CM | POA: Diagnosis not present

## 2017-12-21 DIAGNOSIS — J0100 Acute maxillary sinusitis, unspecified: Secondary | ICD-10-CM

## 2017-12-21 DIAGNOSIS — Z113 Encounter for screening for infections with a predominantly sexual mode of transmission: Secondary | ICD-10-CM | POA: Diagnosis not present

## 2017-12-21 MED ORDER — CETIRIZINE HCL 10 MG PO TABS: 10.0000 mg | ORAL_TABLET | Freq: Every day | ORAL | 11 refills | Status: DC

## 2017-12-21 MED ORDER — AMOXICILLIN-POT CLAVULANATE 875-125 MG PO TABS: 1.0000 | ORAL_TABLET | Freq: Two times a day (BID) | ORAL | 0 refills | Status: DC

## 2017-12-21 MED ORDER — FLUTICASONE PROPIONATE 50 MCG/ACT NA SUSP: 2.0000 | Freq: Every day | NASAL | 6 refills | Status: DC

## 2017-12-21 NOTE — Progress Notes (Signed)
BP 123/80   Pulse 78   Temp 98.3 F (36.8 C) (Oral)   Ht 5\' 3"  (1.6 m)   Wt 192 lb 12.8 oz (87.5 kg)   SpO2 99%   BMI 34.15 kg/m    Subjective:    Patient ID: Jasmine Tran, female    DOB: [birth year], 37 y.o.   MRN: [id number]  HPI: Jasmine Tran is a 37 y.o. female  Chief Complaint  Patient presents with  . Labs Only    pt states she would like to have STD labs done  . URI    pt states she has had a reoccuring sinus infection for about 4 months, states she has congestion and sinus pressure on the right side of her face   Recurrent sinus congestion, pain, and pressure x 4 months. Sometimes associated with cough. Had been trying several days of mucinex here and there with no relief. Not on anything for allergies. Denies current fevers, chills, CP, SOB.   Pt requesting STI screening. No known recent exposures, no dysuria, discharge, rashes or lesions. Hx of chlamydia 15 years ago but otherwise no hx of STIs.   Past Medical History:  Diagnosis Date  . ADD (attention deficit disorder)   . Bipolar 1 disorder with moderate mania ([place])   . GERD (gastroesophageal reflux disease)    Social History   Socioeconomic History  . Marital status: Married    Spouse name: Not on file  . Number of children: Not on file  . Years of education: Not on file  . Highest education level: Not on file  Occupational History  . Not on file  Social Needs  . Financial resource strain: Not on file  . Food insecurity:    Worry: Not on file    Inability: Not on file  . Transportation needs:    Medical: Not on file    Non-medical: Not on file  Tobacco Use  . Smoking status: Former Smoker    Packs/day: 0.25    Years: 5.00    Pack years: 1.25    Types: Cigarettes    Last attempt to quit: 06/04/2015    Years since quitting: 2.5  . Smokeless tobacco: Never Used  Substance and Sexual Activity  . Alcohol use: No  . Drug use: No  . Sexual activity: Yes    Birth control/protection: None    Lifestyle  . Physical activity:    Days per week: Not on file    Minutes per session: Not on file  . Stress: Not on file  Relationships  . Social connections:    Talks on phone: Not on file    Gets together: Not on file    Attends religious service: Not on file    Active member of club or organization: Not on file    Attends meetings of clubs or organizations: Not on file    Relationship status: Not on file  . Intimate partner violence:    Fear of current or ex partner: Not on file    Emotionally abused: Not on file    Physically abused: Not on file    Forced sexual activity: Not on file  Other Topics Concern  . Not on file  Social History Narrative  . Not on file    Relevant past medical, surgical, family and social history reviewed and updated as indicated. Interim medical history since our last visit reviewed. Allergies and medications reviewed and updated.  Review of Systems  Per HPI unless specifically  indicated above     Objective:    BP 123/80   Pulse 78   Temp 98.3 F (36.8 C) (Oral)   Ht 5\' 3"  (1.6 m)   Wt 192 lb 12.8 oz (87.5 kg)   SpO2 99%   BMI 34.15 kg/m   Wt Readings from Last 3 Encounters:  12/21/17 192 lb 12.8 oz (87.5 kg)  11/03/17 189 lb 4 oz (85.8 kg)  10/11/17 170 lb (77.1 kg)    Physical Exam  Constitutional: She is oriented to person, place, and time.  HENT:  Head: Atraumatic.  Right Ear: External ear normal.  Left Ear: External ear normal.  Maxillary sinus ttp Oropharynx erythematous Nasal mucosa edematous and injected with drainage present  Eyes: Conjunctivae and EOM are normal.  Neck: Normal range of motion. Neck supple.  Cardiovascular: Normal rate and regular rhythm.  Pulmonary/Chest: Effort normal and breath sounds normal.  Abdominal: Soft. Bowel sounds are normal.  Genitourinary:  Genitourinary Comments: GU exam declined  Musculoskeletal: Normal range of motion.  Neurological: She is alert and oriented to person, place,  and time.  Skin: Skin is warm and dry. No rash noted.  Psychiatric: She has a normal mood and affect. Her behavior is normal.  Nursing note and vitals reviewed.   Results for orders placed or performed in visit on 11/03/17  Comprehensive metabolic panel  Result Value Ref Range   Glucose 90 65 - 99 mg/dL   BUN 10 6 - 20 mg/dL   Creatinine, Ser 0.69 0.57 - 1.00 mg/dL   GFR calc non Af Amer 113 >59 mL/min/1.73   GFR calc Af Amer 130 >59 mL/min/1.73   BUN/Creatinine Ratio 14 9 - 23   Sodium 139 134 - 144 mmol/L   Potassium 4.4 3.5 - 5.2 mmol/L   Chloride 105 96 - 106 mmol/L   CO2 19 (L) 20 - 29 mmol/L   Calcium 9.5 8.7 - 10.2 mg/dL   Total Protein 7.5 6.0 - 8.5 g/dL   Albumin 4.5 3.5 - 5.5 g/dL   Globulin, Total 3.0 1.5 - 4.5 g/dL   Albumin/Globulin Ratio 1.5 1.2 - 2.2   Bilirubin Total 0.3 0.0 - 1.2 mg/dL   Alkaline Phosphatase 61 39 - 117 IU/L   AST 13 0 - 40 IU/L   ALT 15 0 - 32 IU/L      Assessment & Plan:   Problem List Items Addressed This Visit      Respiratory   Allergic rhinitis - Primary    Will start regular zyrtec and flonase regimen       Other Visit Diagnoses    Routine screening for STI (sexually transmitted infection)       Await lab results. Will treat as needed   Relevant Orders   HIV antibody   RPR   HSV(herpes simplex vrs) 1+2 ab-IgG   GC/Chlamydia Probe Amp   Hepatitis B Surface AntiGEN   Subacute maxillary sinusitis       Likely from uncontrolled allergies. Sinus rinses, augmentin, allergy regimen. F/u if worsening or no improvement   Relevant Medications   fluticasone (FLONASE) 50 MCG/ACT nasal spray   cetirizine (ZYRTEC) 10 MG tablet   amoxicillin-clavulanate (AUGMENTIN) 875-125 MG tablet       Follow up plan: Return if symptoms worsen or fail to improve.

## 2017-12-23 ENCOUNTER — Other Ambulatory Visit: Payer: Self-pay

## 2017-12-23 MED ORDER — NORETHINDRONE ACET-ETHINYL EST 1.5-30 MG-MCG PO TABS: 1.0000 | ORAL_TABLET | Freq: Every day | ORAL | 11 refills | Status: DC

## 2017-12-23 NOTE — Telephone Encounter (Signed)
Patient last seen 12/21/17.

## 2017-12-24 NOTE — Patient Instructions (Signed)
Follow up as needed

## 2017-12-24 NOTE — Assessment & Plan Note (Addendum)
Will start regular zyrtec and flonase regimen

## 2017-12-25 LAB — GC/CHLAMYDIA PROBE AMP
Chlamydia trachomatis, NAA: NEGATIVE
Neisseria gonorrhoeae by PCR: NEGATIVE

## 2017-12-28 ENCOUNTER — Encounter: Payer: Self-pay | Admitting: Family Medicine

## 2017-12-29 ENCOUNTER — Other Ambulatory Visit: Payer: Self-pay | Admitting: Family Medicine

## 2017-12-30 NOTE — Telephone Encounter (Signed)
lamotrigine refill Last Refill:04/17/17 # 90 Last OV: 07/21/17 PCP: Dr Park Liter Pharmacy: [facility]

## 2018-01-01 ENCOUNTER — Other Ambulatory Visit: Payer: Self-pay

## 2018-01-01 MED ORDER — OXCARBAZEPINE 600 MG PO TABS: 600.0000 mg | ORAL_TABLET | Freq: Two times a day (BID) | ORAL | 1 refills | Status: AC

## 2018-01-01 NOTE — Telephone Encounter (Signed)
  Last routine OV: 12/21/17 Next OV: none on file.  Last refilled in Feb.

## 2018-01-11 DIAGNOSIS — S86312A Strain of muscle(s) and tendon(s) of peroneal muscle group at lower leg level, left leg, initial encounter: Secondary | ICD-10-CM | POA: Diagnosis not present

## 2018-01-11 DIAGNOSIS — M25572 Pain in left ankle and joints of left foot: Secondary | ICD-10-CM | POA: Diagnosis not present

## 2018-01-11 DIAGNOSIS — S93492A Sprain of other ligament of left ankle, initial encounter: Secondary | ICD-10-CM | POA: Diagnosis not present

## 2018-01-13 DIAGNOSIS — N760 Acute vaginitis: Secondary | ICD-10-CM | POA: Diagnosis not present

## 2018-01-13 DIAGNOSIS — R300 Dysuria: Secondary | ICD-10-CM | POA: Insufficient documentation

## 2018-01-21 DIAGNOSIS — M25572 Pain in left ankle and joints of left foot: Secondary | ICD-10-CM | POA: Diagnosis not present

## 2018-01-21 DIAGNOSIS — S93422D Sprain of deltoid ligament of left ankle, subsequent encounter: Secondary | ICD-10-CM | POA: Diagnosis not present

## 2018-01-21 DIAGNOSIS — S86312D Strain of muscle(s) and tendon(s) of peroneal muscle group at lower leg level, left leg, subsequent encounter: Secondary | ICD-10-CM | POA: Diagnosis not present

## 2018-01-21 DIAGNOSIS — X58XXXD Exposure to other specified factors, subsequent encounter: Secondary | ICD-10-CM | POA: Diagnosis not present

## 2018-01-21 DIAGNOSIS — S93492A Sprain of other ligament of left ankle, initial encounter: Secondary | ICD-10-CM | POA: Diagnosis not present

## 2018-01-21 DIAGNOSIS — S93492D Sprain of other ligament of left ankle, subsequent encounter: Secondary | ICD-10-CM | POA: Diagnosis not present

## 2018-01-21 DIAGNOSIS — X58XXXA Exposure to other specified factors, initial encounter: Secondary | ICD-10-CM | POA: Diagnosis not present

## 2018-01-23 ENCOUNTER — Ambulatory Visit
Admission: EM | Admit: 2018-01-23 | Discharge: 2018-01-23 | Disposition: A | Discharge status: Home / Self Care | Payer: BLUE CROSS/BLUE SHIELD | Attending: Family Medicine | Admitting: Family Medicine

## 2018-01-23 ENCOUNTER — Encounter: Payer: Self-pay | Admitting: Gynecology

## 2018-01-23 DIAGNOSIS — N898 Other specified noninflammatory disorders of vagina: Secondary | ICD-10-CM | POA: Diagnosis not present

## 2018-01-23 DIAGNOSIS — N738 Other specified female pelvic inflammatory diseases: Secondary | ICD-10-CM

## 2018-01-23 DIAGNOSIS — R102 Pelvic and perineal pain: Secondary | ICD-10-CM

## 2018-01-23 DIAGNOSIS — N739 Female pelvic inflammatory disease, unspecified: Secondary | ICD-10-CM

## 2018-01-23 LAB — URINALYSIS, COMPLETE (UACMP) WITH MICROSCOPIC
Bilirubin Urine: NEGATIVE
Glucose, UA: NEGATIVE mg/dL
Ketones, ur: NEGATIVE mg/dL
LEUKOCYTES UA: NEGATIVE
Nitrite: NEGATIVE
PROTEIN: NEGATIVE mg/dL
Specific Gravity, Urine: 1.02 (ref 1.005–1.030)
pH: 6 (ref 5.0–8.0)

## 2018-01-23 LAB — WET PREP, GENITAL
Clue Cells Wet Prep HPF POC: NONE SEEN
Sperm: NONE SEEN
Trich, Wet Prep: NONE SEEN
Yeast Wet Prep HPF POC: NONE SEEN

## 2018-01-23 MED ORDER — CEFTRIAXONE SODIUM 250 MG IJ SOLR
250.0000 mg | Freq: Once | INTRAMUSCULAR | Status: AC
  Administered 2018-01-23: 250 mg via INTRAMUSCULAR

## 2018-01-23 MED ORDER — AZITHROMYCIN 250 MG PO TABS: ORAL_TABLET | ORAL | 0 refills | Status: DC

## 2018-01-23 MED ORDER — KETOCONAZOLE 2 % EX CREA: 1.0000 "application " | TOPICAL_CREAM | Freq: Every day | CUTANEOUS | 0 refills | Status: AC

## 2018-01-23 NOTE — ED Provider Notes (Signed)
[facility]    CSN: [id number] Arrival date & time: 01/23/18  1402     History   Chief Complaint Chief Complaint  Patient presents with  . Urinary Tract Infection    HPI Jasmine Tran is a 37 y.o. female.   HPI  37 year old female presents with 4 weeks of burning sensation inside her vaginal area.  States that has not noticed any discharge.  She was seen initially on 12/21/2017 at Dr. Shaaron Adler office at which time [facility] chlamydia was obtained which showed negative results.  She had full complement of STD test ordered but because they were unable to draw blood they were not submitted.  Was then was seen by a gynecologist 8 28 2019 did not do an examination but gave her clindamycin vaginal suppositories for possible BV.She is on her 5th day treatment and has not noticed any improvement.  He states she is in a steady relationship with 2 males .  She also admits to having anal sexual relations complains that she has had the same burning sensation around her anus that she has on the vagina.  Tells me that she used miconazole  OTC which actually made it improve over period of time using the cream.         Past Medical History:  Diagnosis Date  . ADD (attention deficit disorder)   . Bipolar 1 disorder with moderate mania ([place])   . GERD (gastroesophageal reflux disease)     Patient Active Problem List   Diagnosis Date Noted  . Heartburn   . Anxiety 06/16/2015  . PCOS (polycystic ovarian syndrome) 04/19/2015  . Adenoma of liver 04/16/2015  . Hematuria 03/30/2015  . Bipolar 1 disorder with moderate mania ([place])   . ADD (attention deficit disorder)   . Carpal tunnel syndrome, right 07/21/2013  . Allergic rhinitis 03/10/2013  . Migraine 03/10/2013  . Obesity (BMI 30-39.9) 03/10/2013  . Tobacco use 03/10/2013    Past Surgical History:  Procedure Laterality Date  . APPENDECTOMY    . CHOLECYSTECTOMY    . COLONOSCOPY WITH PROPOFOL N/A 07/18/2016   Procedure: COLONOSCOPY  WITH PROPOFOL;  Surgeon: Lucilla Lame, MD;  Location: [place];  Service: Endoscopy;  Laterality: N/A;  . ESOPHAGOGASTRODUODENOSCOPY (EGD) WITH PROPOFOL N/A 07/17/2016   Procedure: ESOPHAGOGASTRODUODENOSCOPY (EGD) WITH PROPOFOL;  Surgeon: Lucilla Lame, MD;  Location: [place];  Service: Endoscopy;  Laterality: N/A;  . OPEN HEPATECTOMY   06/04/2015   Liver adenoma removal  . TUMOR REMOVAL     Liver, benign    OB History    Gravida  0   Para  0   Term  0   Preterm  0   AB  0   Living  0     SAB  0   TAB  0   Ectopic  0   Multiple  0   Live Births  0            Home Medications    Prior to Admission medications   Medication Sig Start Date End Date Taking? Authorizing Provider  EPINEPHrine (EPIPEN 2-PAK) 0.3 mg/0.3 mL IJ SOAJ injection Inject into the muscle. 12/12/17  Yes [provider]  L-Methylfolate-Algae (DEPLIN 15) 15-90.314 MG CAPS Take 1 tablet by mouth daily. 06/23/17  Yes Johnson, Megan P, DO  lamoTRIgine (LAMICTAL) 200 MG tablet TAKE 1 TABLET DAILY 12/30/17  Yes Johnson, Megan P, DO  Norethindrone Acetate-Ethinyl Estradiol (JUNEL 1.5/30) 1.5-30 MG-MCG tablet Take 1 tablet by mouth  daily. 12/23/17  Yes Volney American, PA-C  oxcarbazepine (TRILEPTAL) 600 MG tablet Take 1 tablet (600 mg total) by mouth 2 (two) times daily. 01/01/18  Yes Johnson, Megan P, DO  azithromycin (ZITHROMAX) 250 MG tablet Take first 2 tablets together, then 1 every day until finished. 01/23/18   Lorin Picket, PA-C  cetirizine (ZYRTEC) 10 MG tablet Take 1 tablet (10 mg total) by mouth daily. 12/21/17   Volney American, PA-C  ketoconazole (NIZORAL) 2 % cream Apply 1 application topically daily. 01/23/18   Lorin Picket, PA-C    Family History Family History  Problem Relation Age of Onset  . Heart disease Mother   . Heart disease Father   . Hypertension Brother   . Cancer Neg Hx   . COPD Neg Hx   . Diabetes Neg Hx   . Stroke Neg Hx      Social History Social History   Tobacco Use  . Smoking status: Former Smoker    Packs/day: 0.25    Years: 5.00    Pack years: 1.25    Types: Cigarettes    Last attempt to quit: 06/04/2015    Years since quitting: 2.6  . Smokeless tobacco: Never Used  Substance Use Topics  . Alcohol use: No  . Drug use: No     Allergies   Tape; Doxycycline; Morphine and related; Phenergan [promethazine hcl]; Sertraline; and Prednisone   Review of Systems Review of Systems  Constitutional: Negative for activity change, appetite change, chills, fatigue and fever.  Genitourinary: Positive for pelvic pain and vaginal pain.  All other systems reviewed and are negative.    Physical Exam Triage Vital Signs ED Triage Vitals  Enc Vitals Group     BP 01/23/18 1414 122/82     Pulse Rate 01/23/18 1414 86     Resp 01/23/18 1414 16     Temp 01/23/18 1414 98.4 F (36.9 C)     Temp Source 01/23/18 1414 Oral     SpO2 01/23/18 1414 100 %     Weight --      Height --      Head Circumference --      Peak Flow --      Pain Score 01/23/18 1413 0     Pain Loc --      Pain Edu? --      Excl. in [place]? --    No data found.  Updated Vital Signs BP 122/82 (BP Location: Left Arm)   Pulse 86   Temp 98.4 F (36.9 C) (Oral)   Resp 16   LMP 01/09/2018   SpO2 100%   Visual Acuity Right Eye Distance:   Left Eye Distance:   Bilateral Distance:    Right Eye Near:   Left Eye Near:    Bilateral Near:     Physical Exam  Constitutional: She is oriented to person, place, and time. She appears well-developed and well-nourished. No distress.  HENT:  Head: Normocephalic.  Eyes: Pupils are equal, round, and reactive to light. Right eye exhibits no discharge. Left eye exhibits no discharge.  Neck: Normal range of motion.  Genitourinary: Vaginal discharge found.  Genitourinary Comments: Exam was performed with Rosendo Gros, CMA as assistant chaperone.  External genitalia shows adherent white thick film of  the labia.  Was easily removed with light friction.  It was very sensitive to the touching of the area stating that it caused her discomfort.  No vesicles identified.  Speculum exam was then  performed with thick white discharge in the cervical loss in the fornix.  Was were obtained and sent to laboratory for analysis.  Manual exam was then performed significant tenderness in the left adnexa and pain with cervical motion.  This was significant according to the patient.  Patient was then discontinued.  The patient tolerated the procedure well.  Musculoskeletal: Normal range of motion.  Neurological: She is alert and oriented to person, place, and time.  Skin: Skin is warm and dry. She is not diaphoretic.  Psychiatric: She has a normal mood and affect. Her behavior is normal. Judgment and thought content normal.  Nursing note and vitals reviewed.    UC Treatments / Results  Labs (all labs ordered are listed, but only abnormal results are displayed) Labs Reviewed  WET PREP, GENITAL - Abnormal; Notable for the following components:      Result Value   WBC, Wet Prep HPF POC FEW (*)    All other components within normal limits  URINALYSIS, COMPLETE (UACMP) WITH MICROSCOPIC - Abnormal; Notable for the following components:   Hgb urine dipstick SMALL (*)    Bacteria, UA RARE (*)    All other components within normal limits  RPR  HERPES SIMPLEX VIRUS(HSV) DNA BY PCR  HIV ANTIBODY (ROUTINE TESTING)    EKG None  Radiology No results found.  Procedures Procedures (including critical care time)  Medications Ordered in UC Medications  cefTRIAXone (ROCEPHIN) injection 250 mg (250 mg Intramuscular Given 01/23/18 1558)    Initial Impression / Assessment and Plan / UC Course  I have reviewed the triage vital signs and the nursing notes.  Pertinent labs & imaging results that were available during my care of the patient were reviewed by me and considered in my medical decision making (see chart  for details).   Discussed with her my findings today and the likelihood of a PID.  It is rather significant during bimanual examination well as the purulent appearing material from the cervical loss and fornix.  Is agreeable to start treatment.  She is allergic to doxycycline.  We will initiate with Rocephin 250 mg IM and azithromycin 500 mg initially 250 mg for a total of 7 days treatment.  Given her Nizoral cream for external use on the labia and the anus.  Recommend she follow-up with a GYN in the very near future other evaluation.  At her request STD work-up was also obtained.  This was sent to laboratory after blood work was collected.   Final Clinical Impressions(s) / UC Diagnoses   Final diagnoses:  PID (pelvic inflammatory disease)   Discharge Instructions   None    ED Prescriptions    Medication Sig Dispense Auth. Provider   azithromycin (ZITHROMAX) 250 MG tablet Take first 2 tablets together, then 1 every day until finished. 8 tablet Crecencio Mc P, PA-C   ketoconazole (NIZORAL) 2 % cream Apply 1 application topically daily. 15 g Lorin Picket, PA-C     Controlled Substance Prescriptions [place] Controlled Substance Registry consulted? Not Applicable   Lorin Picket, PA-C 01/23/18 1720

## 2018-01-23 NOTE — ED Triage Notes (Signed)
Patient c/o x 4 weeks burning outside vaginal area.

## 2018-01-24 LAB — RPR: RPR Ser Ql: NONREACTIVE

## 2018-01-25 LAB — HERPES SIMPLEX VIRUS(HSV) DNA BY PCR
HSV 1 DNA: NEGATIVE
HSV 2 DNA: NEGATIVE

## 2018-01-25 LAB — HIV ANTIBODY (ROUTINE TESTING W REFLEX): HIV Screen 4th Generation wRfx: NONREACTIVE

## 2018-01-26 ENCOUNTER — Ambulatory Visit: Payer: BLUE CROSS/BLUE SHIELD | Admitting: Family Medicine

## 2018-02-15 DIAGNOSIS — S86312A Strain of muscle(s) and tendon(s) of peroneal muscle group at lower leg level, left leg, initial encounter: Secondary | ICD-10-CM | POA: Diagnosis not present

## 2018-02-15 DIAGNOSIS — S93492D Sprain of other ligament of left ankle, subsequent encounter: Secondary | ICD-10-CM | POA: Diagnosis not present

## 2018-02-15 DIAGNOSIS — S93422D Sprain of deltoid ligament of left ankle, subsequent encounter: Secondary | ICD-10-CM | POA: Diagnosis not present

## 2018-02-15 DIAGNOSIS — S93422A Sprain of deltoid ligament of left ankle, initial encounter: Secondary | ICD-10-CM | POA: Insufficient documentation

## 2018-02-22 DIAGNOSIS — Z7251 High risk heterosexual behavior: Secondary | ICD-10-CM | POA: Diagnosis not present

## 2018-02-22 DIAGNOSIS — B009 Herpesviral infection, unspecified: Secondary | ICD-10-CM | POA: Diagnosis not present

## 2018-02-22 DIAGNOSIS — R300 Dysuria: Secondary | ICD-10-CM | POA: Diagnosis not present

## 2018-02-22 DIAGNOSIS — J029 Acute pharyngitis, unspecified: Secondary | ICD-10-CM | POA: Diagnosis not present

## 2018-03-05 ENCOUNTER — Ambulatory Visit (INDEPENDENT_AMBULATORY_CARE_PROVIDER_SITE_OTHER): Payer: BLUE CROSS/BLUE SHIELD | Admitting: Family Medicine

## 2018-03-05 ENCOUNTER — Encounter: Payer: Self-pay | Admitting: Family Medicine

## 2018-03-05 VITALS — BP 129/84 | HR 93 | Temp 98.4°F | Wt 200.6 lb

## 2018-03-05 DIAGNOSIS — R310 Gross hematuria: Secondary | ICD-10-CM | POA: Diagnosis not present

## 2018-03-05 DIAGNOSIS — J321 Chronic frontal sinusitis: Secondary | ICD-10-CM | POA: Diagnosis not present

## 2018-03-05 DIAGNOSIS — J309 Allergic rhinitis, unspecified: Secondary | ICD-10-CM | POA: Diagnosis not present

## 2018-03-05 MED ORDER — FLUTICASONE PROPIONATE 50 MCG/ACT NA SUSP: 1.0000 | Freq: Two times a day (BID) | NASAL | 1 refills | Status: AC

## 2018-03-05 MED ORDER — MONTELUKAST SODIUM 10 MG PO TABS: 10.0000 mg | ORAL_TABLET | Freq: Every day | ORAL | 1 refills | Status: DC

## 2018-03-05 MED ORDER — CETIRIZINE HCL 10 MG PO TABS: 10.0000 mg | ORAL_TABLET | Freq: Two times a day (BID) | ORAL | 1 refills | Status: AC

## 2018-03-05 MED ORDER — NORETHINDRONE ACET-ETHINYL EST 1.5-30 MG-MCG PO TABS: 1.0000 | ORAL_TABLET | Freq: Every day | ORAL | 3 refills | Status: AC

## 2018-03-05 MED ORDER — TRIAMCINOLONE ACETONIDE 40 MG/ML IJ SUSP
40.0000 mg | Freq: Once | INTRAMUSCULAR | Status: AC
  Administered 2018-03-05: 40 mg via INTRAMUSCULAR

## 2018-03-05 NOTE — Progress Notes (Signed)
BP 129/84   Pulse 93   Temp 98.4 F (36.9 C) (Oral)   Wt 200 lb 9.6 oz (91 kg)   SpO2 98%   BMI 35.53 kg/m    Subjective:    Patient ID: Jasmine Tran, female    DOB: [birth year], 37 y.o.   MRN: [id number]  HPI: Jasmine Tran is a 37 y.o. female  Chief Complaint  Patient presents with  . Nasal Congestion    pt states she has had congestion in her head for about a year, states she feels as if it gets so bad that it starts to effect her eyes, mainly her right eye   Here today with over a year of sinus congestion, facial pain and pressure, headaches. WIll improve on abx, but a week or two off of them it will come back onto her. Having facial swelling now with them. Not currently on anything for her allergies. States she tried zyrtec and didn't notice any difference. Not trying anything at home for sxs other than OTC pain relievers for headaches.   Hematuria for the past 5 months without pain, noted on multiple urinalyses. Pt concerned about this and wanting further workup. Denies fever, chills, CVA tenderness, abdominal pain, dysuria, frequency.   Relevant past medical, surgical, family and social history reviewed and updated as indicated. Interim medical history since our last visit reviewed. Allergies and medications reviewed and updated.  Review of Systems  Per HPI unless specifically indicated above     Objective:    BP 129/84   Pulse 93   Temp 98.4 F (36.9 C) (Oral)   Wt 200 lb 9.6 oz (91 kg)   SpO2 98%   BMI 35.53 kg/m   Wt Readings from Last 3 Encounters:  03/05/18 200 lb 9.6 oz (91 kg)  12/21/17 192 lb 12.8 oz (87.5 kg)  11/03/17 189 lb 4 oz (85.8 kg)    Physical Exam  Constitutional: She is oriented to person, place, and time. She appears well-developed and well-nourished. No distress.  HENT:  Head: Atraumatic.  Right Ear: External ear normal.  Left Ear: External ear normal.  B/l frontal sinus ttp, worst on right Oropharynx erythematous Nasal mucosa  edematous and erythematous  Eyes: EOM are normal.  Neck: Neck supple.  Cardiovascular: Normal rate, regular rhythm and normal heart sounds.  Pulmonary/Chest: Effort normal and breath sounds normal.  Abdominal: Soft. Bowel sounds are normal. There is no tenderness.  Musculoskeletal: Normal range of motion. She exhibits no tenderness (No CVA ttp b/l).  Lymphadenopathy:    She has no cervical adenopathy.  Neurological: She is alert and oriented to person, place, and time.  Skin: Skin is warm and dry.  Psychiatric: She has a normal mood and affect. Her behavior is normal.  Nursing note and vitals reviewed.   Results for orders placed or performed during the hospital encounter of 01/23/18  Wet prep, genital  Result Value Ref Range   Yeast Wet Prep HPF POC NONE SEEN NONE SEEN   Trich, Wet Prep NONE SEEN NONE SEEN   Clue Cells Wet Prep HPF POC NONE SEEN NONE SEEN   WBC, Wet Prep HPF POC FEW (A) NONE SEEN   Sperm NONE SEEN   Urinalysis, Complete w Microscopic  Result Value Ref Range   Color, Urine YELLOW YELLOW   APPearance CLEAR CLEAR   Specific Gravity, Urine 1.020 1.005 - 1.030   pH 6.0 5.0 - 8.0   Glucose, UA NEGATIVE NEGATIVE mg/dL   Hgb urine  dipstick SMALL (A) NEGATIVE   Bilirubin Urine NEGATIVE NEGATIVE   Ketones, ur NEGATIVE NEGATIVE mg/dL   Protein, ur NEGATIVE NEGATIVE mg/dL   Nitrite NEGATIVE NEGATIVE   Leukocytes, UA NEGATIVE NEGATIVE   Squamous Epithelial / LPF 0-5 0 - 5   WBC, UA 0-5 0 - 5 WBC/hpf   RBC / HPF 6-10 0 - 5 RBC/hpf   Bacteria, UA RARE (A) NONE SEEN   Mucus PRESENT    Hyaline Casts, UA PRESENT   RPR  Result Value Ref Range   RPR Ser Ql Non Reactive Non Reactive  Herpes simplex virus (HSV), DNA by PCR Blood  Result Value Ref Range   HSV 1 DNA Negative Negative   HSV 2 DNA Negative Negative  HIV antibody  Result Value Ref Range   HIV Screen 4th Generation wRfx Non Reactive Non Reactive      Assessment & Plan:   Problem List Items Addressed This  Visit      Respiratory   Allergic rhinitis    Discussed importance of daily use of a good allergy regimen given significance and frequency of sinus issues. Start BID zyrtec and flonase and daily use of singulair. Sinus rinses, humidifier. ENT referral placed for further management         Other   Hematuria    Will refer to Urology for further evaluation. Asymptomatic      Relevant Orders   Ambulatory referral to Urology    Other Visit Diagnoses    Chronic frontal sinusitis    -  Primary   IM kenalog today as she does not tolerate PO steroids well. Sinus rinses, allergy regimen, mucinex prn. ENT referral placed for further mgmt given frequency   Relevant Medications   cetirizine (ZYRTEC) 10 MG tablet   fluticasone (FLONASE) 50 MCG/ACT nasal spray   triamcinolone acetonide (KENALOG-40) injection 40 mg (Completed)   Other Relevant Orders   Ambulatory referral to ENT       Follow up plan: Return for CPE.

## 2018-03-07 NOTE — Patient Instructions (Signed)
Follow up for CPE 

## 2018-03-07 NOTE — Assessment & Plan Note (Signed)
Discussed importance of daily use of a good allergy regimen given significance and frequency of sinus issues. Start BID zyrtec and flonase and daily use of singulair. Sinus rinses, humidifier. ENT referral placed for further management

## 2018-03-07 NOTE — Assessment & Plan Note (Signed)
Will refer to Urology for further evaluation. Asymptomatic

## 2018-03-08 ENCOUNTER — Telehealth: Payer: Self-pay

## 2018-03-08 NOTE — Telephone Encounter (Signed)
PA for cetrizine initiated via Cover My Meds.

## 2018-03-08 NOTE — Telephone Encounter (Signed)
Key: Jacquelyne Balint

## 2018-03-16 ENCOUNTER — Telehealth: Payer: Self-pay

## 2018-03-16 ENCOUNTER — Encounter: Payer: Self-pay | Admitting: Family Medicine

## 2018-03-16 NOTE — Telephone Encounter (Signed)
Started a PA for Cetirizine 10mg , which was not needed, medication is covered by plan.   RX Info Rx [place]: [id number] Rx GroupWillodean Rosenthal ID # : [id number]

## 2018-03-22 ENCOUNTER — Encounter: Payer: Self-pay | Admitting: Family Medicine

## 2018-03-22 NOTE — Telephone Encounter (Signed)
Called pt, no answer.Marland KitchenLVM for pt to call back. Letter printed to mail.

## 2018-03-24 ENCOUNTER — Ambulatory Visit: Payer: BLUE CROSS/BLUE SHIELD | Admitting: Urology

## 2018-03-24 NOTE — Progress Notes (Deleted)
03/24/2018 5:40 AM   Jasmine Tran May 24, 1980 [id number]  Referring provider: Valerie Roys, DO [place], [place] [address]  No chief complaint on file.   HPI: Patient is a 37 -year-old Caucasian female who presents today as a referral from Dr. Park Liter for gross hematuria.    Patient was found to have microscopic hematuria in 07/2017 with 6-30 RBC's/hpf with negative culture.  She had it again in 01/2018 with 6-10 RBC's.  Patient *** does/doesn't have a prior history of microscopic hematuria.    She/He does/ does not have a prior history of recurrent urinary tract infections, nephrolithiasis, trauma to the genitourinary tract, BPH or malignancies of the genitourinary tract. ***  She/He does/does not have a family medical history of nephrolithiasis, malignancies of the genitourinary tract or hematuria. ***  Today, she/he are having/not having symptoms of frequent urination, urgency, dysuria, nocturia, incontinence, hesitancy, intermittency, straining to urinate or a weak urinary stream.  Her/His UA today demonstrates ***.  Today, she/he are having/not having symptoms of frequent urination, urgency, dysuria, nocturia, incontinence, hesitancy, intermittency, straining to urinate or a weak urinary stream.  Her/His UA today demonstrates ***.  Patient denies any gross hematuria, dysuria or suprapubic/flank pain.  Patient denies any fevers, chills, nausea or vomiting.  ***  She/He have/have not had any recent imaging studies. ***  He/She are/are not a smoker. She/He is a former smoker, with a*** ppd history.  Quit *** years ago.  They are/are not exposed to secondhand smoke.  They have/have not worked with Sports administrator, trichloroethylene, etc.   ***  He/She has HTN. ***   He/She has a high BMI.     PMH: Past Medical History:  Diagnosis Date  . ADD (attention deficit disorder)   . Bipolar 1 disorder with moderate mania ([place])   . GERD (gastroesophageal reflux  disease)     Surgical History: Past Surgical History:  Procedure Laterality Date  . APPENDECTOMY    . CHOLECYSTECTOMY    . COLONOSCOPY WITH PROPOFOL N/A 07/18/2016   Procedure: COLONOSCOPY WITH PROPOFOL;  Surgeon: Lucilla Lame, MD;  Location: [facility];  Service: Endoscopy;  Laterality: N/A;  . ESOPHAGOGASTRODUODENOSCOPY (EGD) WITH PROPOFOL N/A 07/17/2016   Procedure: ESOPHAGOGASTRODUODENOSCOPY (EGD) WITH PROPOFOL;  Surgeon: Lucilla Lame, MD;  Location: [facility];  Service: Endoscopy;  Laterality: N/A;  . OPEN HEPATECTOMY   06/04/2015   Liver adenoma removal  . TUMOR REMOVAL     Liver, benign    Home Medications:  Allergies as of 03/24/2018      Reactions   Tape Dermatitis   (tegaderm [place])   Doxycycline Nausea And Vomiting   Morphine And Related Hives   Phenergan [promethazine Hcl] Nausea And Vomiting   Sertraline    Other reaction(s): NAUSEA, PUPILS DILATE   Prednisone Nausea And Vomiting      Medication List        Accurate as of 03/24/18  5:40 AM. Always use your most recent med list.          cetirizine 10 MG tablet Commonly known as:  ZYRTEC Take 1 tablet (10 mg total) by mouth 2 (two) times daily.   DEPLIN 15 15-90.314 MG Caps Take 1 tablet by mouth daily.   EPIPEN 2-PAK 0.3 mg/0.3 mL Soaj injection Generic drug:  EPINEPHrine Inject into the muscle.   fluticasone 50 MCG/ACT nasal spray Commonly known as:  FLONASE Place 1 spray into both nostrils 2 (two) times daily.   ketoconazole  2 % cream Commonly known as:  NIZORAL Apply 1 application topically daily.   lamoTRIgine 200 MG tablet Commonly known as:  LAMICTAL TAKE 1 TABLET DAILY   montelukast 10 MG tablet Commonly known as:  SINGULAIR Take 1 tablet (10 mg total) by mouth at bedtime.   Norethindrone Acetate-Ethinyl Estradiol 1.5-30 MG-MCG tablet Commonly known as:  JUNEL,LOESTRIN,MICROGESTIN Take 1 tablet by mouth daily.   oxcarbazepine 600 MG tablet Commonly known as:   TRILEPTAL Take 1 tablet (600 mg total) by mouth 2 (two) times daily.       Allergies:  Allergies  Allergen Reactions  . Tape Dermatitis    (tegaderm [place])  . Doxycycline Nausea And Vomiting  . Morphine And Related Hives  . Phenergan [Promethazine Hcl] Nausea And Vomiting  . Sertraline     Other reaction(s): NAUSEA, PUPILS DILATE  . Prednisone Nausea And Vomiting    Family History: Family History  Problem Relation Age of Onset  . Heart disease Mother   . Heart disease Father   . Hypertension Brother   . Cancer Neg Hx   . COPD Neg Hx   . Diabetes Neg Hx   . Stroke Neg Hx     Social History:  reports that she quit smoking about 2 years ago. Her smoking use included cigarettes. She has a 1.25 pack-year smoking history. She has never used smokeless tobacco. She reports that she does not drink alcohol or use drugs.  ROS:                                        Physical Exam: There were no vitals taken for this visit.  Constitutional:  Well nourished. Alert and oriented, No acute distress. HEENT: [place] AT, moist mucus membranes.  Trachea midline, no masses. Cardiovascular: No clubbing, cyanosis, or edema. Respiratory: Normal respiratory effort, no increased work of breathing. GI: Abdomen is soft, non tender, non distended, no abdominal masses. Liver and spleen not palpable.  No hernias appreciated.  Stool sample for occult testing is not indicated.   GU: No CVA tenderness.  No bladder fullness or masses.  Patient with circumcised/uncircumcised phallus. ***Foreskin easily retracted***  Urethral meatus is patent.  No penile discharge. No penile lesions or rashes. Scrotum without lesions, cysts, rashes and/or edema.  Testicles are located scrotally bilaterally. No masses are appreciated in the testicles. Left and right epididymis are normal. Normal external genitalia, normal pubic hair distribution, no lesions.  Normal urethral meatus, no lesions, no prolapse, no  discharge.   No urethral masses, tenderness and/or tenderness. No bladder fullness, tenderness or masses. Normal vagina mucosa, good estrogen effect, no discharge, no lesions, good pelvic support, no cystocele or rectocele noted.  No cervical motion tenderness.  Uterus is freely mobile and non-fixed.  No adnexal/parametria masses or tenderness noted.  Anus and perineum are without rashes or lesions.   ***  Rectal: Patient with  normal sphincter tone. Anus and perineum without scarring or rashes. No rectal masses are appreciated. Prostate is approximately *** grams, *** nodules are appreciated. Seminal vesicles are normal. Skin: No rashes, bruises or suspicious lesions. Lymph: No cervical or inguinal adenopathy. Neurologic: Grossly intact, no focal deficits, moving all 4 extremities. Psychiatric: Normal mood and affect.  Laboratory Data: Lab Results  Component Value Date   WBC CANCELED 08/04/2017   HGB 13.9 06/23/2017   HCT 40.9 06/23/2017   MCV 90 06/23/2017  PLT 243 06/23/2017    Lab Results  Component Value Date   CREATININE 0.69 11/03/2017    No results found for: PSA  No results found for: TESTOSTERONE  No results found for: HGBA1C  Lab Results  Component Value Date   TSH 2.400 06/23/2017       Component Value Date/Time   CHOL 185 06/23/2017 0859   CHOL 190 05/15/2015 1040   HDL 62 06/23/2017 0859   VLDL 26 05/15/2015 1040   LDLCALC 104 (H) 06/23/2017 0859    Lab Results  Component Value Date   AST 13 11/03/2017   Lab Results  Component Value Date   ALT 15 11/03/2017   No components found for: ALKALINEPHOPHATASE No components found for: BILIRUBINTOTAL  No results found for: ESTRADIOL   Urinalysis *** I have reviewed the labs.  Pertinent Imaging: CLINICAL DATA:  Right upper quadrant pain radiating to the right flank x3 days. History of thick-walled liver resection" in January of 2017 for adenoma.  EXAM: CT ABDOMEN AND PELVIS WITH  CONTRAST  TECHNIQUE: Multidetector CT imaging of the abdomen and pelvis was performed using the standard protocol following bolus administration of intravenous contrast.  CONTRAST:  159mL ISOVUE-300 IOPAMIDOL (ISOVUE-300) INJECTION 61%  COMPARISON:  07/12/2016  FINDINGS: Lower chest: No acute abnormality.  Hepatobiliary: No enhancing mass or biliary dilatation of the liver. Suggestion of postop changes involving the right hepatic dome. Status post cholecystectomy.  Pancreas: Unremarkable. No pancreatic ductal dilatation or surrounding inflammatory changes.  Spleen: Normal in size without focal abnormality.  Adrenals/Urinary Tract: Adrenal glands are unremarkable. Kidneys are normal, without renal calculi, focal lesion, or hydronephrosis. Bladder is unremarkable.  Stomach/Bowel: Stomach is within normal limits. Appendectomy with surgical clips seen at the cecum. No evidence of bowel wall thickening, distention, or inflammatory changes. Moderate fecal residue within the cecum through distal descending colon.  Vascular/Lymphatic: No significant vascular findings are present. No enlarged abdominal or pelvic lymph nodes.  Reproductive: Uterus and bilateral adnexa are unremarkable. Tubular fluid-filled structure in the right adnexa believed to be secondary to adjacent bowel and not hydrosalpinx.  Other: No abdominal wall hernia or abnormality. No abdominopelvic ascites.  Musculoskeletal: No acute or significant osseous findings.  IMPRESSION: 1. Increased colonic stool burden suspicious for constipation from cecum through distal descending colon. 2. No acute bowel inflammation or obstruction. 3. Postop changes suggested of the right hepatic lobe. 4. Status post cholecystectomy without evidence of choledocholithiasis. 5. No nephrolithiasis nor hydronephrosis.   Electronically Signed   By: Ashley Royalty M.D.   On: 12/05/2016 02:52 I have independently  reviewed the films  Assessment & Plan:  ***  1. *** hematuria Explained to the patient that there are a number of causes that can be associated with blood in the urine, such as stones, *** BPH, UTI's, damage to the urinary tract and/or cancer. At this time, I felt that the patient warranted further urologic evaluation.   The AUA guidelines state that a CT urogram is the preferred imaging study to evaluate hematuria. I explained to the patient that a contrast material will be injected into a vein and that in rare instances, an allergic reaction can result and may even life threatening   The patient denies any allergies to contrast***, iodine and/or seafood*** and is not taking metformin.*** Her reproductive status is hysterectomy, postmenopausal, tubal ligation are unknown at this time.  We will obtain a serum pregnancy test today. ***  Explained to the patient that the imaging studies performed on ***  are not the recommended studies by the AUA for the work-up of blood in the urine.    The imaging studies that were performed lack the detail of excluding some urological tumors. The recommended study is a CT urogram.  This study does require the use of contrast material.    At this time, he/she may choose to forego the appropriate study with the understanding that they are risking a missed diagnosis of a urological cancer and proceed with in office cystoscopy Explained to the patient that almost half of all renal cell carcinoma is found incidentally and not to wait on the classic triad of flank pain, palpable abdominal mass and gross hematuria that is generally only seen in more advanced disease.  There are currently 64,000 new cases in the [place] and 14,000 deaths in the [place] every year.   They may also choose to undergo the appropriate study (CTU) or choose to undergo a cystoscopy with bilateral RTG's to complete the hematuria work up. ***  On occasion, we may need to resort to a non-contrast study such as a  renal ultrasound or a non-contrast CT if the patient has a contrast allergy or renal insufficiency.  In the latter case,  I told him/her *** that an upper tract study without contrast will lack the detail of excluding some urologic tumors.  Because of this, he/she would need to undergo cystoscopy with bilateral retrogrades in the OR to complete the hematuria workup in addition to the imaging studies. ***  Following the imaging study,  I've recommended a cystoscopy. I described how this is performed, typically in an office setting with a flexible cystoscope. We described the risks, benefits, and possible side effects, the most common of which is a minor amount of blood in the urine and/or burning which usually resolves in 24 to 48 hours.  *** There is a 20% risk with gross hematuria and a 2% to 5% risk with microscopic hematuria of missing a bladder cancer.  Our goal is to identify the cancer in its early stage so as to give you the greater change of survival and a cure.   *** The patient had the opportunity to ask questions which were answered. Based upon this discussion, the patient is willing to proceed. Therefore, I've ordered: a CT Urogram and cystoscopy. ***  - The patient will return following all of the above for discussion of the results.   - UA ***  - Urine culture ***  - BUN + creatinine  ***  There are no diagnoses linked to this encounter.  No follow-ups on file.  These notes generated with voice recognition software. I apologize for typographical errors.  Zara Council, PA-C  [facility] [address] [place]  [place], [place] [address] [phone]

## 2018-04-06 ENCOUNTER — Other Ambulatory Visit: Payer: Self-pay

## 2018-04-06 MED ORDER — MONTELUKAST SODIUM 10 MG PO TABS: 10.0000 mg | ORAL_TABLET | Freq: Every day | ORAL | 1 refills | Status: AC

## 2018-06-10 ENCOUNTER — Other Ambulatory Visit: Payer: Self-pay | Admitting: Family Medicine

## 2018-06-10 NOTE — Telephone Encounter (Signed)
Requested medication (s) are due for refill today: yes  Requested medication (s) are on the active medication list: yes  Last refill:  10/28  Future visit scheduled: no  Notes to clinic:  Not delegated    Requested Prescriptions  Pending Prescriptions Disp Refills   lamoTRIgine (LAMICTAL) 200 MG tablet [Pharmacy Med Name: LAMOTRIGINE TABS 200MG ] 90 tablet 4    Sig: TAKE 1 TABLET DAILY     Not Delegated - Neurology:  Anticonvulsants Failed - 06/10/2018 12:19 AM      Failed - This refill cannot be delegated      Passed - HCT in normal range and within 360 days    Hematocrit  Date Value Ref Range Status  06/23/2017 40.9 34.0 - 46.6 % Final         Passed - HGB in normal range and within 360 days    Hemoglobin  Date Value Ref Range Status  06/23/2017 13.9 11.1 - 15.9 g/dL Final         Passed - PLT in normal range and within 360 days    Platelets  Date Value Ref Range Status  06/23/2017 243 150 - 379 x10E3/uL Final         Passed - WBC in normal range and within 360 days    WBC  Date Value Ref Range Status  08/04/2017 CANCELED x10E3/uL     Comment:    No lavender top tube submitted.  Result canceled by the ancillary.   12/04/2016 8.1 3.6 - 11.0 K/uL Final         Passed - Valid encounter within last 12 months    Recent Outpatient Visits          3 months ago Chronic frontal sinusitis   [place], [place], [place]   5 months ago Allergic rhinitis, unspecified seasonality, unspecified trigger   [facility] [place], [place]   7 months ago Adenoma of liver   [place], Megan P, DO   10 months ago Abnormal menstrual periods   [facility], Megan P, DO   11 months ago Routine general medical examination at a health care facility   [facility], [place], [place]

## 2018-06-11 IMAGING — CT CT HEAD W/O CM
3 series · 15 of 46 positions shown, 18 images · non-contrast
Comparison: None.

CLINICAL DATA: Migraines.  New visual disturbances.

EXAM:
CT HEAD WITHOUT CONTRAST
TECHNIQUE: Contiguous axial images were obtained from the base of the skull
through the vertex without intravenous contrast.

[Series 2: head wo · axial · 0.47mm/px · z∈[-138,-18]mm · 9 of 29 slices shown, 12 images]
[im 3/29  brain]
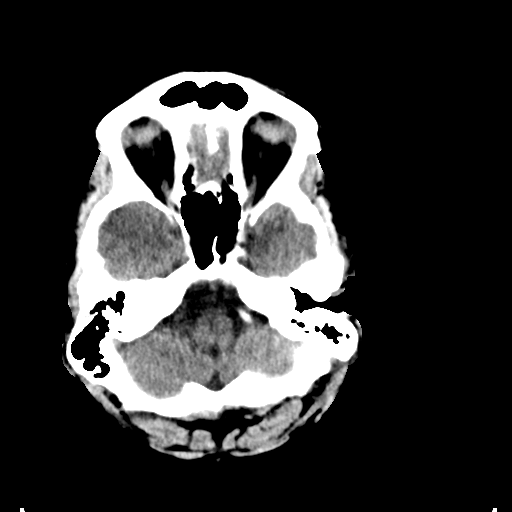
[im 3/29  bone]
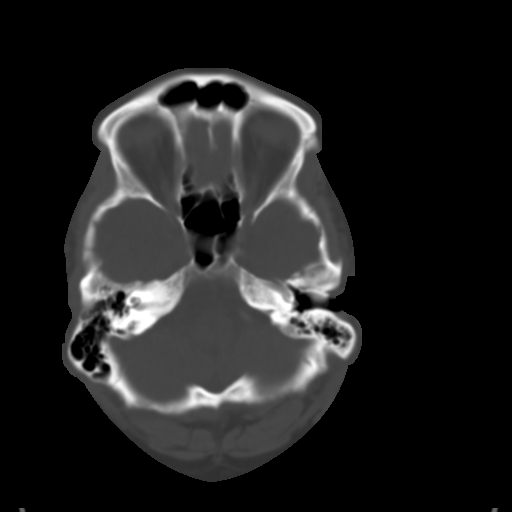
[im 6/29  brain]
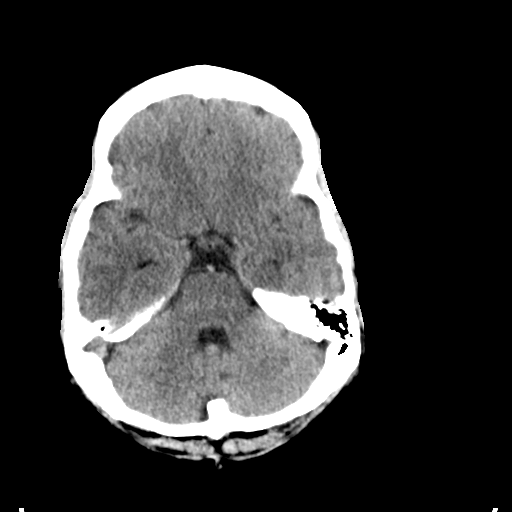
[im 9/29  brain]
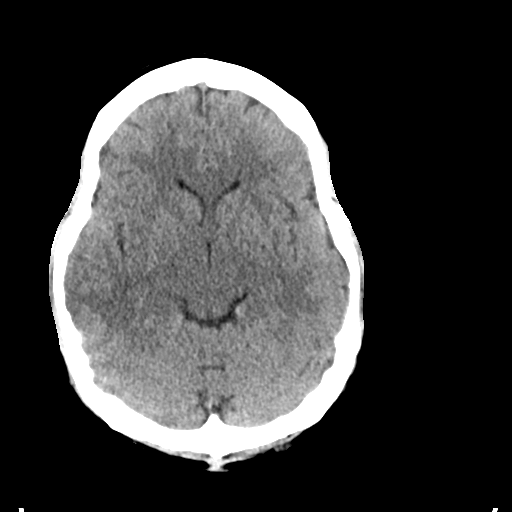
[im 12/29  brain]
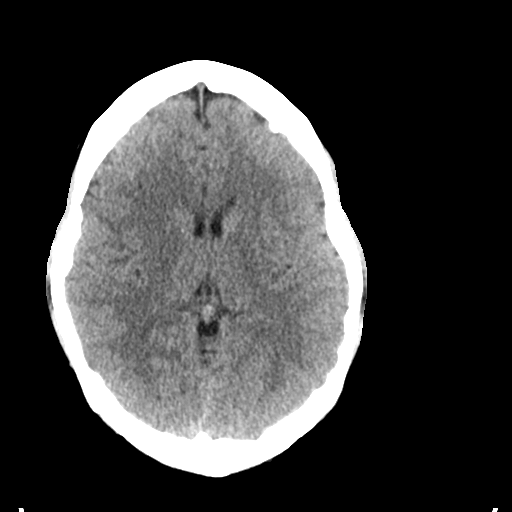
[im 15/29  brain]
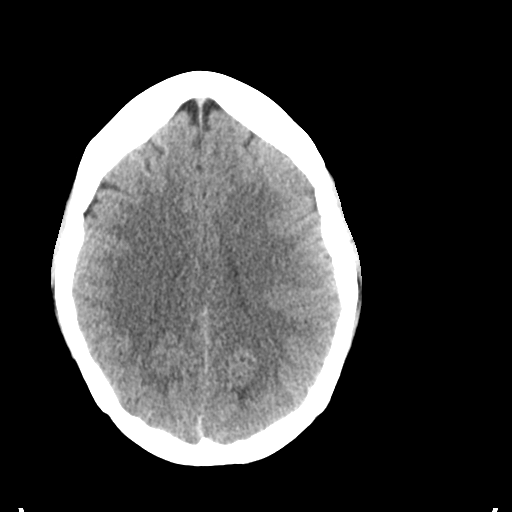
[im 15/29  bone]
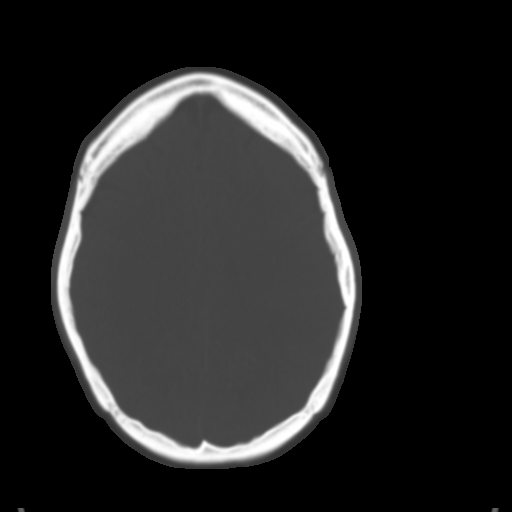
[im 18/29  brain]
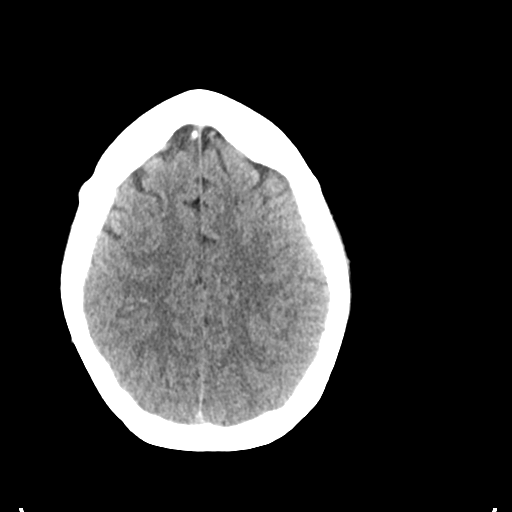
[im 21/29  brain]
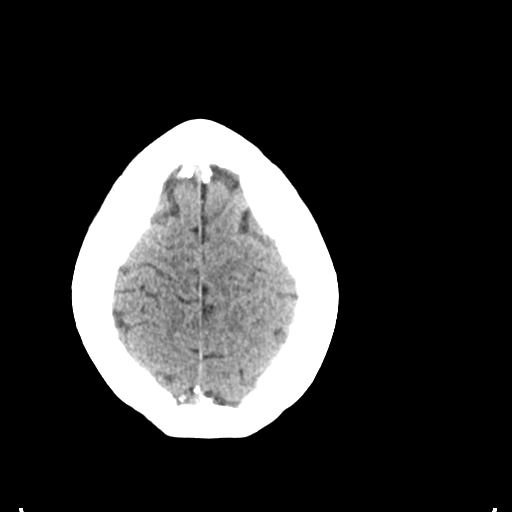
[im 24/29  brain]
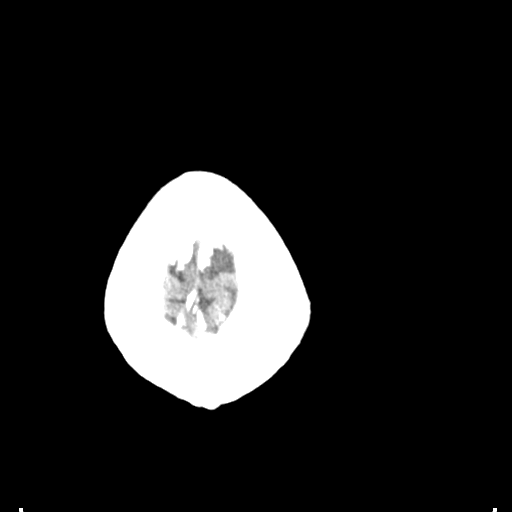
[im 27/29  brain]
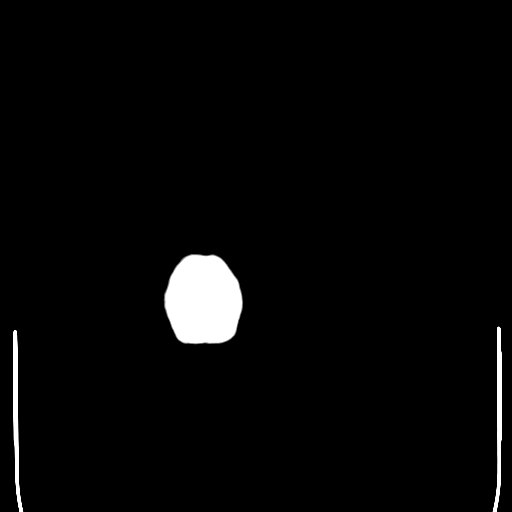
[im 27/29  bone]
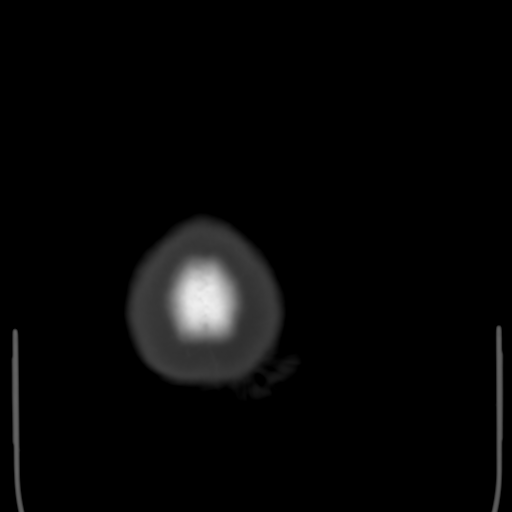

[Series 4: coronal soft tissue · coronal · 0.28mm/px · 3 of 67 slices shown]
[im 23/67  brain]
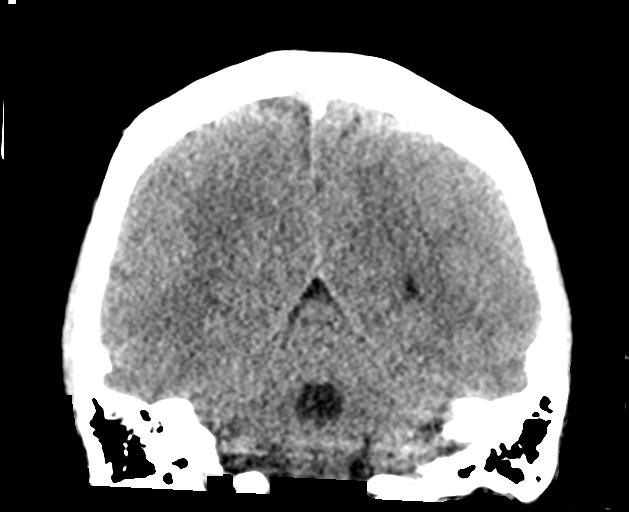
[im 30/67  brain]
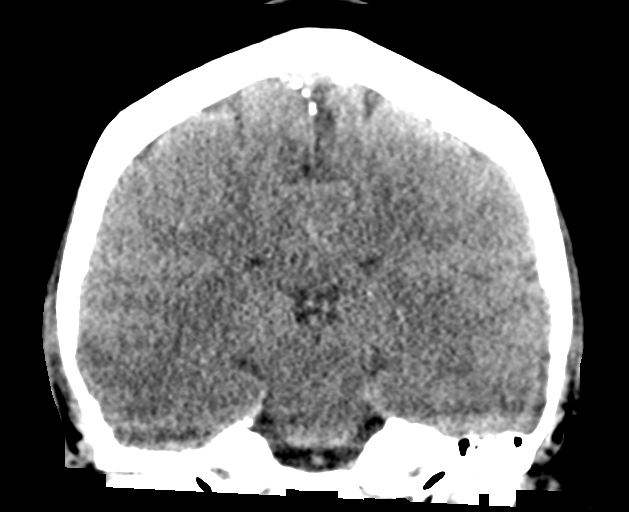
[im 37/67  brain]
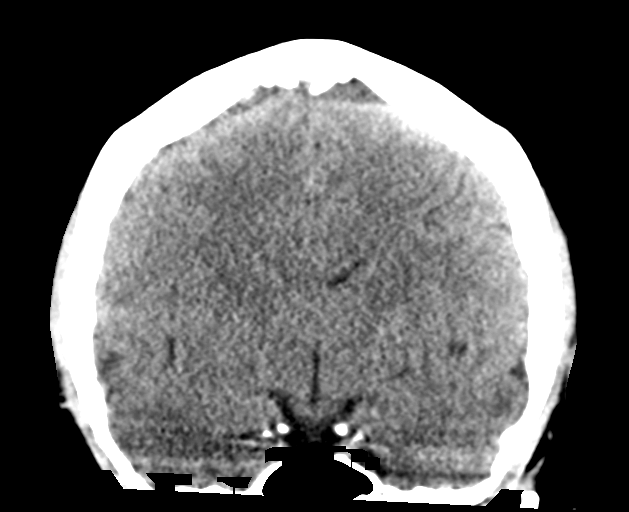

[Series 5: sagittal soft tissue · sagittal · 0.29mm/px · 3 of 53 slices shown]
[im 18/53  brain]
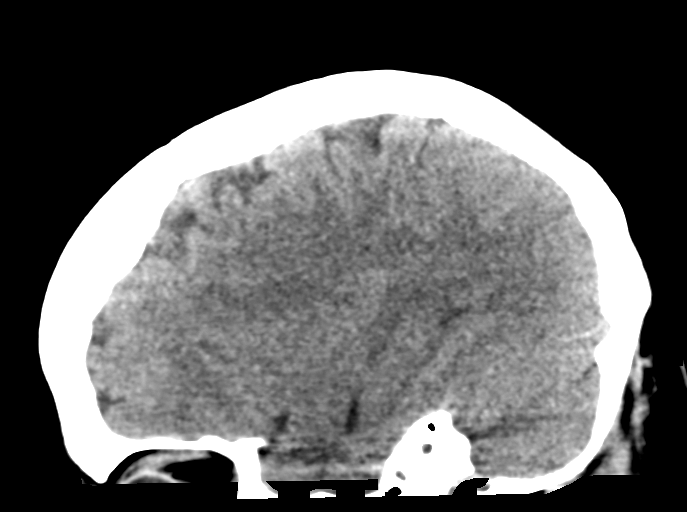
[im 27/53  brain]
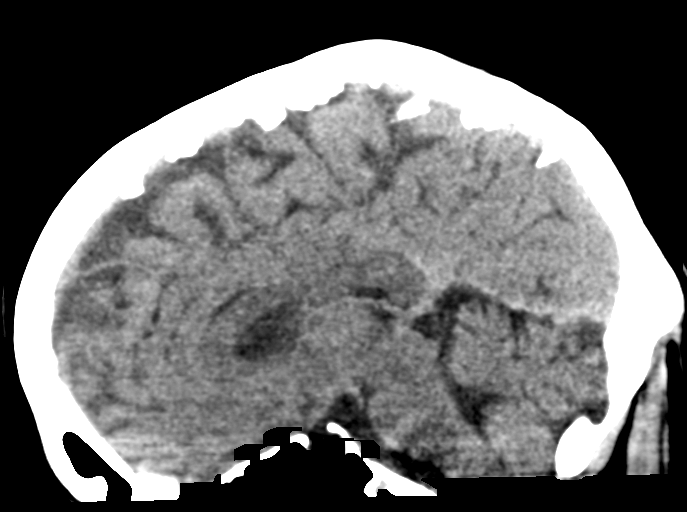
[im 35/53  brain]
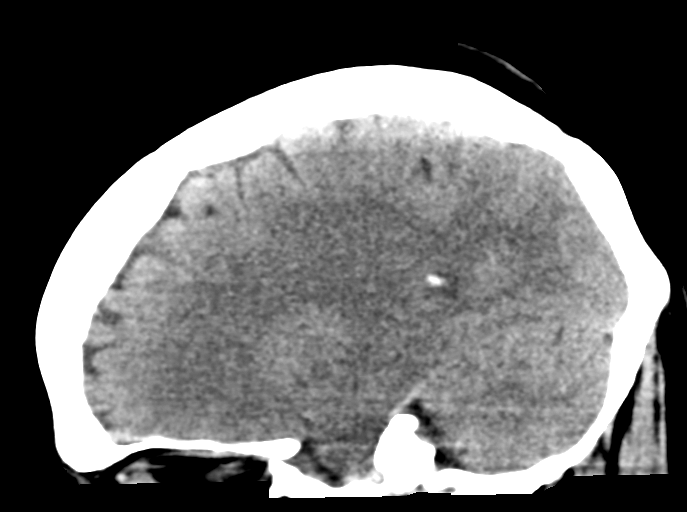

[15 of 46 positions shown; findings below may reference images not displayed]

FINDINGS: Brain: No evidence of acute infarction, hemorrhage, hydrocephalus,
extra-axial collection or mass lesion/mass effect.

Vascular: No hyperdense vessel or unexpected calcification.

Skull: Normal. Negative for fracture or focal lesion.

Sinuses/Orbits: No acute finding.

Other: None.
IMPRESSION: 1. No acute intracranial abnormalities.  Normal brain.

## 2018-11-17 ENCOUNTER — Other Ambulatory Visit: Payer: Self-pay | Admitting: Family Medicine

## 2018-11-17 NOTE — Telephone Encounter (Signed)
Please avise
